# Patient Record
Sex: Male | Born: 1975 | Race: White | Hispanic: No | Marital: Single | Smoking: Current every day smoker
Health system: Southern US, Community
[De-identification: ages and names within clinical notes are randomized; demographics above are authoritative.]

## PROBLEM LIST (undated history)

## (undated) DIAGNOSIS — M549 Dorsalgia, unspecified: Secondary | ICD-10-CM

## (undated) DIAGNOSIS — F1920 Other psychoactive substance dependence, uncomplicated: Secondary | ICD-10-CM

## (undated) DIAGNOSIS — G8929 Other chronic pain: Secondary | ICD-10-CM

## (undated) DIAGNOSIS — F29 Unspecified psychosis not due to a substance or known physiological condition: Secondary | ICD-10-CM

## (undated) HISTORY — PX: OTHER SURGICAL HISTORY: SHX169

## (undated) HISTORY — PX: SHOULDER SURGERY: SHX246

---

## 2001-03-19 ENCOUNTER — Emergency Department (HOSPITAL_COMMUNITY): Admission: EM | Admit: 2001-03-19 | Discharge: 2001-03-19 | Payer: Self-pay | Admitting: Emergency Medicine

## 2004-04-29 ENCOUNTER — Emergency Department (HOSPITAL_COMMUNITY): Admission: EM | Admit: 2004-04-29 | Discharge: 2004-04-29 | Payer: Self-pay | Admitting: Emergency Medicine

## 2008-11-11 ENCOUNTER — Emergency Department (HOSPITAL_COMMUNITY): Admission: EM | Admit: 2008-11-11 | Discharge: 2008-11-11 | Payer: Self-pay | Admitting: Emergency Medicine

## 2009-01-13 ENCOUNTER — Emergency Department (HOSPITAL_COMMUNITY): Admission: EM | Admit: 2009-01-13 | Discharge: 2009-01-13 | Payer: Self-pay | Admitting: Emergency Medicine

## 2010-02-06 ENCOUNTER — Emergency Department (HOSPITAL_COMMUNITY): Admission: EM | Admit: 2010-02-06 | Discharge: 2010-02-06 | Payer: Self-pay | Admitting: Emergency Medicine

## 2010-02-11 ENCOUNTER — Emergency Department (HOSPITAL_COMMUNITY): Admission: EM | Admit: 2010-02-11 | Discharge: 2010-02-11 | Payer: Self-pay | Admitting: Emergency Medicine

## 2010-06-03 ENCOUNTER — Emergency Department (HOSPITAL_COMMUNITY)
Admission: EM | Admit: 2010-06-03 | Discharge: 2010-06-04 | Payer: Self-pay | Source: Home / Self Care | Admitting: Emergency Medicine

## 2010-09-28 ENCOUNTER — Emergency Department (HOSPITAL_COMMUNITY): Payer: BC Managed Care – PPO

## 2010-09-28 ENCOUNTER — Emergency Department (HOSPITAL_COMMUNITY)
Admission: EM | Admit: 2010-09-28 | Discharge: 2010-09-28 | Disposition: A | Discharge status: Home / Self Care | Payer: BC Managed Care – PPO | Attending: Emergency Medicine | Admitting: Emergency Medicine

## 2010-09-28 DIAGNOSIS — W260XXA Contact with knife, initial encounter: Secondary | ICD-10-CM | POA: Insufficient documentation

## 2010-09-28 DIAGNOSIS — S51809A Unspecified open wound of unspecified forearm, initial encounter: Secondary | ICD-10-CM | POA: Insufficient documentation

## 2010-09-28 DIAGNOSIS — Y92009 Unspecified place in unspecified non-institutional (private) residence as the place of occurrence of the external cause: Secondary | ICD-10-CM | POA: Insufficient documentation

## 2010-09-28 LAB — CBC
HCT: 33.1 % — ABNORMAL LOW (ref 39.0–52.0)
Hemoglobin: 11.3 g/dL — ABNORMAL LOW (ref 13.0–17.0)
MCH: 31.2 pg (ref 26.0–34.0)
MCHC: 34.1 g/dL (ref 30.0–36.0)
MCV: 91.4 fL (ref 78.0–100.0)
Platelets: 284 10*3/uL (ref 150–400)
RBC: 3.62 MIL/uL — ABNORMAL LOW (ref 4.22–5.81)
RDW: 14 % (ref 11.5–15.5)

## 2010-09-28 LAB — BASIC METABOLIC PANEL
BUN: 5 mg/dL — ABNORMAL LOW (ref 6–23)
CO2: 25 mEq/L (ref 19–32)
Calcium: 9 mg/dL (ref 8.4–10.5)
Chloride: 104 mEq/L (ref 96–112)
Creatinine, Ser: 0.79 mg/dL (ref 0.4–1.5)
GFR calc Af Amer: >60 mL/min (ref >60)
GFR calc non Af Amer: >60 mL/min (ref >60)
Glucose, Bld: 112 mg/dL — ABNORMAL HIGH (ref 70–99)
Sodium: 137 mEq/L (ref 135–145)

## 2010-09-28 LAB — URINALYSIS, ROUTINE W REFLEX MICROSCOPIC
Glucose, UA: NEGATIVE mg/dL
Hgb urine dipstick: NEGATIVE
Ketones, ur: NEGATIVE mg/dL
Specific Gravity, Urine: >1.03 — ABNORMAL HIGH (ref 1.005–1.030)
Urobilinogen, UA: 0.2 mg/dL (ref 0.0–1.0)
pH: 6 (ref 5.0–8.0)

## 2010-09-28 LAB — DIFFERENTIAL
Basophils Absolute: 0 10*3/uL (ref 0.0–0.1)
Basophils Relative: 0 % (ref 0–1)
Eosinophils Absolute: 0.1 10*3/uL (ref 0.0–0.7)
Eosinophils Relative: 0 % (ref 0–5)
Lymphocytes Relative: 17 % (ref 12–46)
Lymphs Abs: 2.7 10*3/uL (ref 0.7–4.0)
Monocytes Absolute: 1.1 10*3/uL — ABNORMAL HIGH (ref 0.1–1.0)
Monocytes Relative: 7 % (ref 3–12)
Neutro Abs: 12.3 10*3/uL — ABNORMAL HIGH (ref 1.7–7.7)

## 2010-09-28 LAB — URINE MICROSCOPIC-ADD ON

## 2010-09-28 LAB — RAPID URINE DRUG SCREEN, HOSP PERFORMED
Amphetamines: NOT DETECTED
Barbiturates: NOT DETECTED
Benzodiazepines: NOT DETECTED
Cocaine: POSITIVE — AB
Opiates: NOT DETECTED

## 2010-09-28 LAB — ETHANOL: Alcohol, Ethyl (B): <11 mg/dL — ABNORMAL HIGH (ref 0–10)
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
## 2010-09-29 ENCOUNTER — Encounter (HOSPITAL_COMMUNITY): Payer: BC Managed Care – PPO

## 2010-09-29 ENCOUNTER — Other Ambulatory Visit: Payer: Self-pay | Admitting: Infectious Diseases

## 2010-09-29 LAB — COMPREHENSIVE METABOLIC PANEL WITH GFR
ALT: 6 U/L (ref 0–53)
AST: 12 U/L (ref 0–37)
Albumin: 3.7 g/dL (ref 3.5–5.2)
Alkaline Phosphatase: 78 U/L (ref 39–117)
BUN: 3 mg/dL — ABNORMAL LOW (ref 6–23)
CO2: 29 meq/L (ref 19–32)
Calcium: 9.8 mg/dL (ref 8.4–10.5)
Chloride: 102 meq/L (ref 96–112)
Creatinine, Ser: 0.77 mg/dL (ref 0.4–1.5)
GFR calc non Af Amer: >60 mL/min
Glucose, Bld: 96 mg/dL (ref 70–99)
Potassium: 4.2 meq/L (ref 3.5–5.1)
Sodium: 137 meq/L (ref 135–145)
Total Bilirubin: 0.3 mg/dL (ref 0.3–1.2)
Total Protein: 7 g/dL (ref 6.0–8.3)

## 2010-09-29 LAB — CBC
HCT: 37.3 % — ABNORMAL LOW (ref 39.0–52.0)
Hemoglobin: 12.2 g/dL — ABNORMAL LOW (ref 13.0–17.0)
MCH: 30.9 pg (ref 26.0–34.0)
MCHC: 32.7 g/dL (ref 30.0–36.0)
MCV: 94.4 fL (ref 78.0–100.0)
Platelets: 347 K/uL (ref 150–400)
RBC: 3.95 MIL/uL — ABNORMAL LOW (ref 4.22–5.81)
RDW: 14.2 % (ref 11.5–15.5)
WBC: 10.7 K/uL — ABNORMAL HIGH (ref 4.0–10.5)

## 2010-09-29 LAB — URINALYSIS, ROUTINE W REFLEX MICROSCOPIC
Bilirubin Urine: NEGATIVE
Glucose, UA: NEGATIVE mg/dL
Hgb urine dipstick: NEGATIVE
Ketones, ur: NEGATIVE mg/dL
Nitrite: NEGATIVE
Protein, ur: NEGATIVE mg/dL
Specific Gravity, Urine: 1.01 (ref 1.005–1.030)
Urobilinogen, UA: 0.2 mg/dL (ref 0.0–1.0)
pH: 6.5 (ref 5.0–8.0)

## 2010-09-29 LAB — SURGICAL PCR SCREEN
MRSA, PCR: NEGATIVE
Staphylococcus aureus: POSITIVE — AB

## 2010-10-01 ENCOUNTER — Ambulatory Visit (HOSPITAL_COMMUNITY)
Admission: RE | Admit: 2010-10-01 | Discharge: 2010-10-01 | Disposition: A | Discharge status: Home / Self Care | Payer: BC Managed Care – PPO | Source: Ambulatory Visit | Attending: Orthopaedic Surgery | Admitting: Orthopaedic Surgery

## 2010-10-01 ENCOUNTER — Emergency Department (HOSPITAL_COMMUNITY)
Admission: EM | Admit: 2010-10-01 | Discharge: 2010-10-01 | Discharge status: Against Medical Advice | Payer: BC Managed Care – PPO | Attending: Emergency Medicine | Admitting: Emergency Medicine

## 2010-10-01 DIAGNOSIS — S56909A Unspecified injury of unspecified muscles, fascia and tendons at forearm level, unspecified arm, initial encounter: Secondary | ICD-10-CM | POA: Insufficient documentation

## 2010-10-01 DIAGNOSIS — W261XXA Contact with sword or dagger, initial encounter: Secondary | ICD-10-CM | POA: Insufficient documentation

## 2010-10-01 DIAGNOSIS — W260XXA Contact with knife, initial encounter: Secondary | ICD-10-CM | POA: Insufficient documentation

## 2010-10-01 DIAGNOSIS — Z5309 Procedure and treatment not carried out because of other contraindication: Secondary | ICD-10-CM | POA: Insufficient documentation

## 2010-10-01 DIAGNOSIS — Z01812 Encounter for preprocedural laboratory examination: Secondary | ICD-10-CM | POA: Insufficient documentation

## 2010-10-01 DIAGNOSIS — S51809A Unspecified open wound of unspecified forearm, initial encounter: Secondary | ICD-10-CM | POA: Insufficient documentation

## 2010-10-01 LAB — RAPID URINE DRUG SCREEN, HOSP PERFORMED
Barbiturates: NOT DETECTED
Benzodiazepines: NOT DETECTED
Cocaine: POSITIVE — AB
Opiates: POSITIVE — AB
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
## 2010-10-05 ENCOUNTER — Ambulatory Visit (HOSPITAL_COMMUNITY)
Admission: RE | Admit: 2010-10-05 | Discharge: 2010-10-06 | Disposition: A | Discharge status: Home / Self Care | Payer: BC Managed Care – PPO | Source: Ambulatory Visit | Attending: Orthopaedic Surgery | Admitting: Orthopaedic Surgery

## 2010-10-05 DIAGNOSIS — W260XXA Contact with knife, initial encounter: Secondary | ICD-10-CM | POA: Insufficient documentation

## 2010-10-05 DIAGNOSIS — S51809A Unspecified open wound of unspecified forearm, initial encounter: Secondary | ICD-10-CM | POA: Insufficient documentation

## 2010-10-05 LAB — RAPID URINE DRUG SCREEN, HOSP PERFORMED
Amphetamines: NOT DETECTED
Barbiturates: NOT DETECTED
Benzodiazepines: NOT DETECTED
Opiates: POSITIVE — AB
Tetrahydrocannabinol: NOT DETECTED

## 2010-10-07 NOTE — Discharge Summary (Signed)
  NAMEHALEN, Ronnie Wilson              ACCOUNT NO.:  [id number]  MEDICAL RECORD NO.:  [id number]           PATIENT TYPE:  O  LOCATION:  A203                          FACILITY:  [facility]  PHYSICIAN:  J. Darreld Mclean, M.D. DATE OF BIRTH:  1975/11/17  DATE OF ADMISSION:  10/05/2010 DATE OF DISCHARGE:  05/15/2012LH                              DISCHARGE SUMMARY   DISCHARGE DIAGNOSIS:  Laceration, extensor tendons (six) left forearm.  PROCEDURE:  Forearm repair of extensor tendons (six) left forearm.  DISCHARGE STATUS AND PROGNOSIS:  Good.  DISPOSITION:  Home.  DISCHARGE MEDICATIONS:  Norco 7.5/325 one every 4 hours p.r.n. pain.  Keep dressing dry and to be seen in my office on May 28.  The patient was admitted after surgery yesterday with repair of 6 tendons to the left midforearm dorsally.  He has done well and received a PCA IV morphine during the evening and remained afebrile.  I did add nicotine patches as he is a smoker.  His pain is controlled.  I have explained to him to keep dressing dry, keep the splint on, did not remove the splint, contact me if any difficulty or any problem.  His labs were normal.  The patient had recently scheduled for the surgery last week but had a positive drug screen prior to surgery, positive for cocaine, he has been told and advised strongly to avoid the cocaine.  I have talked to his family.  Lives with his mother, I also talked to her.  Again I will see him in the office if any difficulty.                                           ______________________________ Shela Commons. Darreld Mclean, M.D.     JWK/MEDQ  D:  10/06/2010  T:  10/06/2010  Job:  [id number]  Electronically Signed by Darreld Mclean M.D. on 10/07/2010 12:56:08 PM
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
## 2010-12-02 ENCOUNTER — Emergency Department (HOSPITAL_COMMUNITY): Payer: BC Managed Care – PPO

## 2010-12-02 ENCOUNTER — Emergency Department (HOSPITAL_COMMUNITY)
Admission: EM | Admit: 2010-12-02 | Discharge: 2010-12-02 | Disposition: A | Discharge status: Home / Self Care | Payer: BC Managed Care – PPO | Attending: Emergency Medicine | Admitting: Emergency Medicine

## 2010-12-02 ENCOUNTER — Encounter: Payer: Self-pay | Admitting: *Deleted

## 2010-12-02 DIAGNOSIS — F17200 Nicotine dependence, unspecified, uncomplicated: Secondary | ICD-10-CM | POA: Insufficient documentation

## 2010-12-02 DIAGNOSIS — M545 Low back pain, unspecified: Secondary | ICD-10-CM | POA: Insufficient documentation

## 2010-12-02 HISTORY — DX: Other chronic pain: G89.29

## 2010-12-02 HISTORY — DX: Dorsalgia, unspecified: M54.9

## 2010-12-02 MED ORDER — IBUPROFEN 800 MG PO TABS: 800.0000 mg | ORAL_TABLET | Freq: Three times a day (TID) | ORAL | Status: AC

## 2010-12-02 MED ORDER — CYCLOBENZAPRINE HCL 10 MG PO TABS: 10.0000 mg | ORAL_TABLET | Freq: Two times a day (BID) | ORAL | Status: AC | PRN

## 2010-12-02 MED ORDER — CYCLOBENZAPRINE HCL 10 MG PO TABS
10.0000 mg | ORAL_TABLET | Freq: Once | ORAL | Status: AC
  Administered 2010-12-02: 10 mg via ORAL
  Filled 2010-12-02: qty 1

## 2010-12-02 MED ORDER — KETOROLAC TROMETHAMINE 60 MG/2ML IM SOLN
60.0000 mg | Freq: Once | INTRAMUSCULAR | Status: AC
  Administered 2010-12-02: 60 mg via INTRAMUSCULAR
  Filled 2010-12-02: qty 2

## 2010-12-02 NOTE — ED Notes (Signed)
C/o lower back pain x 2 weeks. Denies injury. States that he has chronic back pain and it has gotten worse. Hurts to move.

## 2010-12-02 NOTE — ED Provider Notes (Signed)
History     Chief Complaint  Patient presents with  . Back Pain   HPI Comments: Completely relieved with rest / laying down - denies any red flags.  No meds given at home - denies imagine.  Patient is a 35 y.o. male presenting with back pain. The history is provided by the patient.  Back Pain  This is a chronic problem. Episode onset: 2 yearsa ago, worse in the last 2 weeks. Episode frequency: intermittently. The problem has been gradually worsening. The pain is associated with no known injury. The pain is present in the lumbar spine (bilateral lower back). The pain is at a severity of 8/10. The pain is moderate. The symptoms are aggravated by bending (walking). Worse during: sharp. Pertinent negatives include no fever, no numbness and no weakness. He has tried nothing for the symptoms.    Past Medical History  Diagnosis Date  . Chronic back pain     Past Surgical History  Procedure Date  . Arm surgery  left     tendon and laceration repair  . Shoulder surgery     History reviewed. No pertinent family history.  History  Substance Use Topics  . Smoking status: Current Everyday Smoker -- 1.5 packs/day  . Smokeless tobacco: Not on file  . Alcohol Use: No      Review of Systems  Constitutional: Negative for fever and chills.  HENT: Negative for neck pain.   Genitourinary: Negative for difficulty urinating.  Musculoskeletal: Positive for back pain.  Neurological: Negative for weakness and numbness.    Physical Exam  BP 156/87  Pulse 95  Temp(Src) 98.9 F (37.2 C) (Oral)  Resp 20  Ht 5\' 6"  (1.676 m)  Wt 145 lb (65.772 kg)  BMI 23.40 kg/m2  SpO2 100%  Physical Exam  Nursing note and vitals reviewed. Constitutional: He appears well-developed and well-nourished. No distress.  HENT:  Head: Normocephalic and atraumatic.  Eyes: Conjunctivae are normal. No scleral icterus.  Neck: Normal range of motion. Neck supple.  Cardiovascular: Normal rate and regular rhythm.     No murmur heard. Pulmonary/Chest: Effort normal and breath sounds normal. No respiratory distress.  Musculoskeletal: Normal range of motion. He exhibits no edema and no tenderness.       Lumbar back: He exhibits pain.  Neurological: He is alert. He has normal strength. No sensory deficit. Coordination and gait normal.  Reflex Scores:      Patellar reflexes are 2+ on the right side and 2+ on the left side.      Gait normal, speech clear  Skin: He is not diaphoretic.    ED Course  Procedures  MDM No imaging in the past - has normal neuro exam, worsening pain - meds orderedf including IM toradol and flexeril.    Pt xray without acute abnormalities. Will d/c home, return precautions given.  I have reviewed the results with pt  who is aware of findings and has expressed understanding and need for appropriate follow up.  All of pt questions pertaining to results have been answered.   Vida Roller, MD 12/02/10 2051

## 2011-02-04 ENCOUNTER — Emergency Department (HOSPITAL_COMMUNITY)
Admission: EM | Admit: 2011-02-04 | Discharge: 2011-02-05 | Disposition: A | Discharge status: Home / Self Care | Payer: BC Managed Care – PPO | Attending: Emergency Medicine | Admitting: Emergency Medicine

## 2011-02-04 ENCOUNTER — Encounter (HOSPITAL_COMMUNITY): Payer: Self-pay | Admitting: *Deleted

## 2011-02-04 DIAGNOSIS — M5412 Radiculopathy, cervical region: Secondary | ICD-10-CM | POA: Insufficient documentation

## 2011-02-04 DIAGNOSIS — F17200 Nicotine dependence, unspecified, uncomplicated: Secondary | ICD-10-CM | POA: Insufficient documentation

## 2011-02-04 DIAGNOSIS — G8929 Other chronic pain: Secondary | ICD-10-CM | POA: Insufficient documentation
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
## 2011-02-05 MED ORDER — NAPROXEN 500 MG PO TABS: 500.0000 mg | ORAL_TABLET | Freq: Two times a day (BID) | ORAL | Status: DC

## 2011-02-05 MED ORDER — DEXAMETHASONE SODIUM PHOSPHATE 4 MG/ML IJ SOLN
10.0000 mg | Freq: Once | INTRAMUSCULAR | Status: AC
  Administered 2011-02-05: 10 mg via INTRAMUSCULAR
  Filled 2011-02-05: qty 3

## 2011-02-05 MED ORDER — CYCLOBENZAPRINE HCL 10 MG PO TABS
10.0000 mg | ORAL_TABLET | Freq: Once | ORAL | Status: AC
  Administered 2011-02-05: 10 mg via ORAL
  Filled 2011-02-05: qty 1

## 2011-02-05 MED ORDER — CYCLOBENZAPRINE HCL 10 MG PO TABS: 10.0000 mg | ORAL_TABLET | Freq: Two times a day (BID) | ORAL | Status: AC | PRN

## 2011-02-05 MED ORDER — KETOROLAC TROMETHAMINE 60 MG/2ML IM SOLN
60.0000 mg | Freq: Once | INTRAMUSCULAR | Status: AC
  Administered 2011-02-05: 60 mg via INTRAMUSCULAR
  Filled 2011-02-05: qty 2

## 2011-02-05 NOTE — ED Notes (Signed)
Pt left er stating no needs. Pt self ambulated with a steady gait
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
## 2011-02-16 ENCOUNTER — Other Ambulatory Visit: Payer: Self-pay | Admitting: Sports Medicine

## 2011-02-16 DIAGNOSIS — M545 Low back pain: Secondary | ICD-10-CM

## 2011-02-16 DIAGNOSIS — M5126 Other intervertebral disc displacement, lumbar region: Secondary | ICD-10-CM

## 2011-02-20 ENCOUNTER — Ambulatory Visit
Admission: RE | Admit: 2011-02-20 | Discharge: 2011-02-20 | Disposition: A | Discharge status: Home / Self Care | Payer: BC Managed Care – PPO | Source: Ambulatory Visit | Attending: Sports Medicine | Admitting: Sports Medicine

## 2011-02-20 DIAGNOSIS — M545 Low back pain: Secondary | ICD-10-CM

## 2011-02-20 DIAGNOSIS — M5126 Other intervertebral disc displacement, lumbar region: Secondary | ICD-10-CM

## 2011-06-04 ENCOUNTER — Encounter (HOSPITAL_COMMUNITY): Payer: Self-pay

## 2011-06-04 ENCOUNTER — Emergency Department (HOSPITAL_COMMUNITY): Payer: Self-pay

## 2011-06-04 ENCOUNTER — Emergency Department (HOSPITAL_COMMUNITY)
Admission: EM | Admit: 2011-06-04 | Discharge: 2011-06-04 | Disposition: A | Discharge status: Home / Self Care | Payer: Self-pay | Attending: Emergency Medicine | Admitting: Emergency Medicine

## 2011-06-04 DIAGNOSIS — J111 Influenza due to unidentified influenza virus with other respiratory manifestations: Secondary | ICD-10-CM | POA: Insufficient documentation

## 2011-06-04 DIAGNOSIS — M549 Dorsalgia, unspecified: Secondary | ICD-10-CM | POA: Insufficient documentation

## 2011-06-04 DIAGNOSIS — G8929 Other chronic pain: Secondary | ICD-10-CM | POA: Insufficient documentation

## 2011-06-04 DIAGNOSIS — F17200 Nicotine dependence, unspecified, uncomplicated: Secondary | ICD-10-CM | POA: Insufficient documentation

## 2011-06-04 MED ORDER — PREDNISONE 20 MG PO TABS
60.0000 mg | ORAL_TABLET | Freq: Once | ORAL | Status: AC
  Administered 2011-06-04: 60 mg via ORAL
  Filled 2011-06-04: qty 3

## 2011-06-04 MED ORDER — ALBUTEROL SULFATE HFA 108 (90 BASE) MCG/ACT IN AERS
2.0000 | INHALATION_SPRAY | RESPIRATORY_TRACT | Status: DC
  Administered 2011-06-04: 2 via RESPIRATORY_TRACT
  Filled 2011-06-04: qty 6.7

## 2011-06-04 MED ORDER — HYDROCOD POLST-CHLORPHEN POLST 10-8 MG/5ML PO LQCR: 5.0000 mL | Freq: Two times a day (BID) | ORAL | Status: DC

## 2011-06-04 MED ORDER — DOXYCYCLINE HYCLATE 100 MG PO CAPS: 100.0000 mg | ORAL_CAPSULE | Freq: Two times a day (BID) | ORAL | Status: AC

## 2011-06-04 MED ORDER — PREDNISONE 10 MG PO TABS: ORAL_TABLET | ORAL | Status: DC

## 2011-06-04 MED ORDER — HYDROCOD POLST-CHLORPHEN POLST 10-8 MG/5ML PO LQCR
5.0000 mL | Freq: Once | ORAL | Status: AC
  Administered 2011-06-04: 5 mL via ORAL
  Filled 2011-06-04: qty 5

## 2011-06-04 MED ORDER — ONDANSETRON 4 MG PO TBDP
4.0000 mg | ORAL_TABLET | Freq: Once | ORAL | Status: AC
  Administered 2011-06-04: 4 mg via ORAL
  Filled 2011-06-04: qty 1

## 2011-06-04 MED ORDER — DOXYCYCLINE HYCLATE 100 MG PO TABS
100.0000 mg | ORAL_TABLET | Freq: Once | ORAL | Status: AC
Start: 2011-06-04 — End: 2011-06-04
  Administered 2011-06-04: 100 mg via ORAL
  Filled 2011-06-04: qty 1

## 2011-06-04 NOTE — ED Notes (Signed)
Discharge instructions discussed with pt as well as meds. Pt to be discharged after being seen by Resp.

## 2011-06-04 NOTE — ED Provider Notes (Signed)
History     CSN: [id number]  Arrival date & time 06/04/11  1501   First MD Initiated Contact with Patient 06/04/11 1803      Chief Complaint  Patient presents with  . Generalized Body Aches  . Weakness  . Chest Pain  . Cough    (Consider location/radiation/quality/duration/timing/severity/associated sxs/prior treatment) HPI Comments: Patient presents with one week of increased congestion nasal congestion cough and pain with deep breathing and cough, more on the right than the left. He complains of generalized body aches. He has a productive cough but there is no hemoptysis. He is sure he's had fever but has not measured her temperature at home. Patient complains of increased weakness and generally not feeling well. He presents at this time for additional evaluation and possible treatment of these problems.  Patient is a 36 y.o. male presenting with weakness, chest pain, and cough. The history is provided by the patient.  Weakness The primary symptoms include fever. Primary symptoms do not include seizures or dizziness.  Additional symptoms include weakness. Additional symptoms do not include photophobia or hallucinations.  Chest Pain Primary symptoms include a fever, fatigue and cough. Pertinent negatives for primary symptoms include no shortness of breath, no wheezing, no palpitations, no abdominal pain and no dizziness.  Associated symptoms include weakness.  Pertinent negatives for past medical history include no seizures.    Cough Associated symptoms include chest pain. Pertinent negatives include no shortness of breath and no wheezing.    Past Medical History  Diagnosis Date  . Chronic back pain     Past Surgical History  Procedure Date  . Arm surgery  left     tendon and laceration repair  . Shoulder surgery     No family history on file.  History  Substance Use Topics  . Smoking status: Current Everyday Smoker -- 1.5 packs/day  . Smokeless tobacco: Not on  file  . Alcohol Use: No      Review of Systems  Constitutional: Positive for fever and fatigue. Negative for activity change.       All ROS Neg except as noted in HPI  HENT: Positive for congestion and postnasal drip. Negative for nosebleeds and neck pain.   Eyes: Negative for photophobia and discharge.  Respiratory: Positive for cough. Negative for shortness of breath and wheezing.   Cardiovascular: Positive for chest pain. Negative for palpitations.  Gastrointestinal: Negative for abdominal pain and blood in stool.  Genitourinary: Negative for dysuria, frequency and hematuria.  Musculoskeletal: Negative for back pain and arthralgias.  Skin: Negative.   Neurological: Positive for weakness. Negative for dizziness, seizures and speech difficulty.  Psychiatric/Behavioral: Negative for hallucinations and confusion.    Allergies  Review of patient's allergies indicates no known allergies.  Home Medications   Current Outpatient Rx  Name Route Sig Dispense Refill  . NAPROXEN 500 MG PO TABS Oral Take 1 tablet (500 mg total) by mouth 2 (two) times daily. 30 tablet 0    BP 129/77  Pulse 88  Temp(Src) 98.8 F (37.1 C) (Oral)  Resp 20  SpO2 100%  Physical Exam  Nursing note and vitals reviewed. Constitutional: He is oriented to person, place, and time. He appears well-developed and well-nourished.  Non-toxic appearance.  HENT:  Head: Normocephalic.  Right Ear: Tympanic membrane and external ear normal.  Left Ear: Tympanic membrane and external ear normal.       Nasal congestion present.  Eyes: EOM and lids are normal. Pupils are equal, round, and  reactive to light.  Neck: Normal range of motion. Neck supple. Carotid bruit is not present.  Cardiovascular: Normal rate, regular rhythm, normal heart sounds, intact distal pulses and normal pulses.   Pulmonary/Chest: No respiratory distress. He has wheezes. He exhibits tenderness.  Abdominal: Soft. Bowel sounds are normal. There is  no tenderness. There is no guarding.  Musculoskeletal: Normal range of motion.  Lymphadenopathy:       Head (right side): No submandibular adenopathy present.       Head (left side): No submandibular adenopathy present.    He has no cervical adenopathy.  Neurological: He is alert and oriented to person, place, and time. He has normal strength. No cranial nerve deficit or sensory deficit.  Skin: Skin is warm and dry. No rash noted.  Psychiatric: He has a normal mood and affect. His speech is normal.    ED Course  Procedures (including critical care time) Pulse oximetry 100% on room air. Within normal limits by my interpretation. Labs Reviewed - No data to display Dg Chest 2 View  06/04/2011  *RADIOLOGY REPORT*  Clinical Data: Cough with shortness of breath.  CHEST - 2 VIEW  Comparison: None.  Findings: The lungs are clear without focal infiltrate, edema, pneumothorax or pleural effusion. The cardiopericardial silhouette is within normal limits for size. Imaged bony structures of the thorax are intact.  IMPRESSION: No acute findings.  Original Report Authenticated By: ERIC A. MANSELL, M.D.     Dx: Influenza   MDM  I have reviewed nursing notes, vital signs, and all appropriate lab and imaging results for this patient. Patient has a one-week history of increasing cough at times is productive. He has increased weakness and generalized body aches as well as some nasal congestion. Patient is advised to use influenza precautions. He is to increase fluids. Wash hands frequently. He is to use Tylenol and Motrin for fever. He will be prescribed Tussionex, prednisone, doxycycline, and given an inhaler of albuterol.       Kathie Dike, [place] 06/04/11 1812

## 2011-06-04 NOTE — ED Notes (Signed)
Awaiting Resp tx for pt to be discharged.

## 2011-06-04 NOTE — ED Notes (Signed)
Pt presents with cough, right sided chest pain when coughing, weakness, and general body aches x 7 day.

## 2011-06-05 NOTE — ED Provider Notes (Signed)
Medical screening examination/treatment/procedure(s) were performed by non-physician practitioner and as supervising physician I was immediately available for consultation/collaboration.   Laray Anger, DO 06/05/11 0013

## 2011-08-17 ENCOUNTER — Encounter (HOSPITAL_COMMUNITY): Payer: Self-pay | Admitting: *Deleted

## 2011-08-17 ENCOUNTER — Emergency Department (HOSPITAL_COMMUNITY)
Admission: EM | Admit: 2011-08-17 | Discharge: 2011-08-18 | Disposition: A | Discharge status: Home / Self Care | Payer: Self-pay | Attending: Emergency Medicine | Admitting: Emergency Medicine

## 2011-08-17 DIAGNOSIS — M549 Dorsalgia, unspecified: Secondary | ICD-10-CM | POA: Insufficient documentation

## 2011-08-17 DIAGNOSIS — F17200 Nicotine dependence, unspecified, uncomplicated: Secondary | ICD-10-CM | POA: Insufficient documentation

## 2011-08-17 DIAGNOSIS — G8929 Other chronic pain: Secondary | ICD-10-CM | POA: Insufficient documentation

## 2011-08-17 NOTE — ED Notes (Signed)
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

## 2011-08-18 ENCOUNTER — Emergency Department (HOSPITAL_COMMUNITY): Payer: Self-pay

## 2011-08-18 MED ORDER — OXYCODONE-ACETAMINOPHEN 5-325 MG PO TABS: 1.0000 | ORAL_TABLET | Freq: Four times a day (QID) | ORAL | Status: AC | PRN

## 2011-08-18 MED ORDER — ONDANSETRON HCL 4 MG/2ML IJ SOLN
4.0000 mg | Freq: Once | INTRAMUSCULAR | Status: AC
  Administered 2011-08-18: 4 mg via INTRAVENOUS
  Filled 2011-08-18: qty 2

## 2011-08-18 MED ORDER — PREDNISONE 10 MG PO TABS: 20.0000 mg | ORAL_TABLET | Freq: Two times a day (BID) | ORAL | Status: DC

## 2011-08-18 MED ORDER — HYDROMORPHONE HCL PF 2 MG/ML IJ SOLN
2.0000 mg | Freq: Once | INTRAMUSCULAR | Status: AC
  Administered 2011-08-18: 2 mg via INTRAVENOUS
  Filled 2011-08-18: qty 1

## 2011-08-18 MED ORDER — METHYLPREDNISOLONE SODIUM SUCC 125 MG IJ SOLR
125.0000 mg | Freq: Once | INTRAMUSCULAR | Status: AC
  Administered 2011-08-18: 125 mg via INTRAVENOUS
  Filled 2011-08-18: qty 2

## 2011-08-18 MED ORDER — OXYCODONE-ACETAMINOPHEN 5-325 MG PO TABS
2.0000 | ORAL_TABLET | Freq: Once | ORAL | Status: AC
  Administered 2011-08-18: 2 via ORAL
  Filled 2011-08-18: qty 2

## 2011-08-18 NOTE — Discharge Instructions (Signed)
Follow up as needed Dr. Regino Schultze can see you for your back pain.  He can refer you to specialist as needed.  You need to establish yourself with a primary care doctor.

## 2011-08-19 NOTE — ED Provider Notes (Signed)
History     CSN: [id number]  Arrival date & time 08/17/11  2232   First MD Initiated Contact with Patient 08/18/11 0013      Chief Complaint  Patient presents with  . Back Pain    (Consider location/radiation/quality/duration/timing/severity/associated sxs/prior treatment) Patient is a 36 y.o. male presenting with back pain. The history is provided by the patient (pt complains of right low back pain). A language interpreter was used.  Back Pain  This is a recurrent problem. The current episode started 12 to 24 hours ago. The problem occurs constantly. The problem has not changed since onset.The pain is associated with no known injury. The pain is present in the lumbar spine. The quality of the pain is described as shooting. The pain radiates to the right thigh. The pain is at a severity of 6/10. The pain is moderate. The symptoms are aggravated by bending and twisting. The pain is the same all the time. Stiffness is present all day. Pertinent negatives include no chest pain, no fever, no headaches and no abdominal pain. He has tried nothing for the symptoms. The treatment provided no relief. Risk factors include lack of exercise.    Past Medical History  Diagnosis Date  . Chronic back pain     Past Surgical History  Procedure Date  . Arm surgery  left     tendon and laceration repair  . Shoulder surgery     History reviewed. No pertinent family history.  History  Substance Use Topics  . Smoking status: Current Everyday Smoker -- 1.5 packs/day    Types: Cigarettes  . Smokeless tobacco: Not on file  . Alcohol Use: No      Review of Systems  Constitutional: Negative for fever and fatigue.  HENT: Negative for congestion, sinus pressure and ear discharge.   Eyes: Negative for discharge.  Respiratory: Negative for cough.   Cardiovascular: Negative for chest pain.  Gastrointestinal: Negative for abdominal pain and diarrhea.  Genitourinary: Negative for frequency and  hematuria.  Musculoskeletal: Positive for back pain.  Skin: Negative for rash.  Neurological: Negative for seizures and headaches.  Hematological: Negative.   Psychiatric/Behavioral: Negative for hallucinations.    Allergies  Review of patient's allergies indicates no known allergies.  Home Medications   Current Outpatient Rx  Name Route Sig Dispense Refill  . HYDROCOD POLST-CPM POLST ER 10-8 MG/5ML PO LQCR Oral Take 5 mLs by mouth 2 times daily at 12 noon and 4 pm. 100 mL 0  . OXYCODONE-ACETAMINOPHEN 5-325 MG PO TABS Oral Take 1 tablet by mouth every 6 (six) hours as needed for pain. 30 tablet 0  . PREDNISONE 10 MG PO TABS  6,5,4,3,2,1 - take with food 21 tablet 0  . PREDNISONE 10 MG PO TABS Oral Take 2 tablets (20 mg total) by mouth 2 (two) times daily. 15 tablet 0    BP 140/90  Pulse 88  Temp(Src) 98.8 F (37.1 C) (Oral)  Resp 16  Ht 5\' 6"  (1.676 m)  Wt 135 lb (61.236 kg)  BMI 21.79 kg/m2  SpO2 94%  Physical Exam  Constitutional: He is oriented to person, place, and time. He appears well-developed.  HENT:  Head: Normocephalic and atraumatic.  Eyes: Conjunctivae and EOM are normal. No scleral icterus.  Neck: Neck supple. No thyromegaly present.  Cardiovascular: Normal rate and regular rhythm.  Exam reveals no gallop and no friction rub.   No murmur heard. Pulmonary/Chest: No stridor. He has no wheezes. He has no rales. He  exhibits no tenderness.  Abdominal: He exhibits no distension. There is no tenderness. There is no rebound.  Musculoskeletal: He exhibits no edema.       Tender right lumbar muscles with pos.  straigh leg raise on right  Lymphadenopathy:    He has no cervical adenopathy.  Neurological: He is oriented to person, place, and time. He displays normal reflexes. Coordination normal.  Skin: No rash noted. No erythema.  Psychiatric: He has a normal mood and affect. His behavior is normal.    ED Course  Procedures (including critical care time)  Labs  Reviewed - No data to display Dg Lumbar Spine Complete  08/18/2011  *RADIOLOGY REPORT*  Clinical Data: 36 year old male with low back pain.  LUMBAR SPINE - COMPLETE 4+ VIEW  Comparison: 02/19/2011  Findings: Five non-rib bearing lumbar type vertebra are again identified. There is no evidence of fracture or subluxation. The disc spaces are maintained. There is no evidence of focal bony lesion or spondylolysis.  IMPRESSION: Unremarkable lumbar spine series.  Original Report Authenticated By: Rosendo Gros, M.D.     1. Back pain    Pt improved with tx   MDM  Back pain with sciatica.  Normal mri in august.  Pt to follow up         Benny Lennert, MD 08/19/11 [phone]

## 2011-09-05 ENCOUNTER — Encounter (HOSPITAL_COMMUNITY): Payer: Self-pay | Admitting: *Deleted

## 2011-09-05 ENCOUNTER — Emergency Department (HOSPITAL_COMMUNITY)
Admission: EM | Admit: 2011-09-05 | Discharge: 2011-09-06 | Disposition: A | Discharge status: Home / Self Care | Payer: Self-pay | Attending: Emergency Medicine | Admitting: Emergency Medicine

## 2011-09-05 DIAGNOSIS — M549 Dorsalgia, unspecified: Secondary | ICD-10-CM | POA: Insufficient documentation

## 2011-09-05 DIAGNOSIS — F17200 Nicotine dependence, unspecified, uncomplicated: Secondary | ICD-10-CM | POA: Insufficient documentation

## 2011-09-05 DIAGNOSIS — R209 Unspecified disturbances of skin sensation: Secondary | ICD-10-CM | POA: Insufficient documentation

## 2011-09-05 DIAGNOSIS — G8929 Other chronic pain: Secondary | ICD-10-CM

## 2011-09-05 DIAGNOSIS — M545 Low back pain, unspecified: Secondary | ICD-10-CM | POA: Insufficient documentation

## 2011-09-05 MED ORDER — KETOROLAC TROMETHAMINE 60 MG/2ML IM SOLN
60.0000 mg | Freq: Once | INTRAMUSCULAR | Status: AC
  Administered 2011-09-06: 60 mg via INTRAMUSCULAR
  Filled 2011-09-05: qty 2

## 2011-09-05 MED ORDER — CYCLOBENZAPRINE HCL 10 MG PO TABS
10.0000 mg | ORAL_TABLET | Freq: Once | ORAL | Status: AC
  Administered 2011-09-06: 10 mg via ORAL
  Filled 2011-09-05: qty 1

## 2011-09-05 NOTE — ED Notes (Signed)
C/o chronic lower back pain, worse today

## 2011-09-06 ENCOUNTER — Encounter (HOSPITAL_COMMUNITY): Payer: Self-pay | Admitting: Emergency Medicine

## 2011-09-06 ENCOUNTER — Emergency Department (HOSPITAL_COMMUNITY): Payer: Self-pay

## 2011-09-06 MED ORDER — CYCLOBENZAPRINE HCL 10 MG PO TABS: 10.0000 mg | ORAL_TABLET | Freq: Three times a day (TID) | ORAL | Status: AC | PRN

## 2011-09-06 NOTE — ED Provider Notes (Signed)
History     CSN: [id number]  Arrival date & time 09/05/11  2236   First MD Initiated Contact with Patient 09/05/11 2352      Chief Complaint  Patient presents with  . Back Pain    (Consider location/radiation/quality/duration/timing/severity/associated sxs/prior treatment) HPI Comments: Patient c/o persistent low back pain that's been persistent for several months.  C/o pain that radiates to his left buttock, thigh and to his foot.  Describes a "burning, numb sensation" in his leg when in the seated position that resolves with lying flat or standing.  He denies incontinence of feces or urine, dysuria, or perineal numbness.  Patient states he was seen here last month for similar symptoms and given referral for a PMD, but he was unable to get an appt.    Patient is a 36 y.o. male presenting with back pain. The history is provided by the patient.  Back Pain  This is a chronic problem. The current episode started more than 1 week ago. The problem occurs constantly. The problem has not changed since onset.The pain is associated with no known injury. The pain is present in the lumbar spine and sacro-iliac joint. The quality of the pain is described as burning, aching and shooting. The pain radiates to the left thigh and left foot. The pain is moderate. The symptoms are aggravated by certain positions, twisting and bending (sitting position). The pain is the same all the time. Associated symptoms include numbness, leg pain and tingling. Pertinent negatives include no chest pain, no fever, no abdominal pain, no abdominal swelling, no bowel incontinence, no perianal numbness, no bladder incontinence, no dysuria, no pelvic pain, no paresthesias, no paresis and no weakness. He has tried NSAIDs, muscle relaxants and analgesics for the symptoms. The treatment provided no relief.    Past Medical History  Diagnosis Date  . Chronic back pain     Past Surgical History  Procedure Date  . Arm surgery  left      tendon and laceration repair  . Shoulder surgery     No family history on file.  History  Substance Use Topics  . Smoking status: Current Everyday Smoker -- 1.5 packs/day    Types: Cigarettes  . Smokeless tobacco: Not on file  . Alcohol Use: No      Review of Systems  Constitutional: Negative for fever and appetite change.  Cardiovascular: Negative for chest pain.  Gastrointestinal: Negative for nausea, vomiting, abdominal pain and bowel incontinence.  Genitourinary: Negative for bladder incontinence, dysuria, hematuria, flank pain, decreased urine volume and pelvic pain.  Musculoskeletal: Positive for back pain. Negative for joint swelling.  Skin: Negative.   Neurological: Positive for tingling and numbness. Negative for weakness and paresthesias.  All other systems reviewed and are negative.    Allergies  Review of patient's allergies indicates no known allergies.  Home Medications   Current Outpatient Rx  Name Route Sig Dispense Refill  . HYDROCOD POLST-CPM POLST ER 10-8 MG/5ML PO LQCR Oral Take 5 mLs by mouth 2 times daily at 12 noon and 4 pm. 100 mL 0  . PREDNISONE 10 MG PO TABS  6,5,4,3,2,1 - take with food 21 tablet 0  . PREDNISONE 10 MG PO TABS Oral Take 2 tablets (20 mg total) by mouth 2 (two) times daily. 15 tablet 0    BP 129/79  Pulse 86  Temp(Src) 98.5 F (36.9 C) (Oral)  Resp 20  Ht 5\' 6"  (1.676 m)  Wt 135 lb (61.236 kg)  BMI  21.79 kg/m2  SpO2 100%  Physical Exam  Nursing note and vitals reviewed. Constitutional: He is oriented to person, place, and time. He appears well-developed and well-nourished. No distress.  HENT:  Head: Normocephalic and atraumatic.  Cardiovascular: Normal rate, regular rhythm, normal heart sounds and intact distal pulses.   No murmur heard. Pulmonary/Chest: Effort normal and breath sounds normal.  Musculoskeletal: He exhibits tenderness. He exhibits no edema.       Lumbar back: He exhibits tenderness, bony  tenderness and pain. He exhibits normal range of motion, no swelling, no edema, no deformity, no laceration, no spasm and normal pulse.       Back:       ttp of the left lumbar paraspinal muscles and SI joint space.   Neurological: He is alert and oriented to person, place, and time. No sensory deficit. He exhibits normal muscle tone. Coordination normal.  Reflex Scores:      Patellar reflexes are 2+ on the right side and 2+ on the left side.      Achilles reflexes are 2+ on the right side and 2+ on the left side. Skin: Skin is warm and dry.    ED Course  Procedures (including critical care time)   Dg Lumbar Spine Complete  08/18/2011  *RADIOLOGY REPORT*  Clinical Data: 35 year old male with low back pain.  LUMBAR SPINE - COMPLETE 4+ VIEW  Comparison: 02/19/2011  Findings: Five non-rib bearing lumbar type vertebra are again identified. There is no evidence of fracture or subluxation. The disc spaces are maintained. There is no evidence of focal bony lesion or spondylolysis.  IMPRESSION: Unremarkable lumbar spine series.  Original Report Authenticated By: Rosendo Gros, M.D.     Patient also had MRI of lumbar spine from 01/2011 that showed Mild facet degenerative changes in the lower lumbar spine but no  focal disc protrusions, significant spinal or foraminal stenosis.   MDM   Previous ED charts, imaging and nursing notes were reviewed.    Patient has ttp of the lumbar paraspinal muscles and right SI joint.  No focal neuro deficits on exam.  Pain also reproduced with SLR on right.  Patient was seen here on 08/17/11 for same, advised to f/u with PMD but pt states they would not see him.    Pain likely related to sciatica.  Symptoms are chronic.   I have reviewed the patient on the [place] narcotics database.  Patient has been receiving narcotic prescriptions from his neurosurgeon.  I have discussed the pt hx and care plan with the EDP.       Dequavion Follette L. Lexa Coronado, [place] 09/06/11 0126

## 2011-09-06 NOTE — Discharge Instructions (Signed)
Chronic Back Pain When back pain lasts longer than 3 months, it is called chronic back pain.This pain can be frustrating, but the cause of the pain is rarely dangerous.People with chronic back pain often go through certain periods that are more intense (flare-ups). CAUSES Chronic back pain can be caused by wear and tear (degeneration) on different structures in your back. These structures may include bones, ligaments, or discs. This degeneration may result in more pressure being placed on the nerves that travel to your legs and feet. This can lead to pain traveling from the low back down the back of the legs. When pain lasts longer than 3 months, it is not unusual for people to experience anxiety or depression. Anxiety and depression can also contribute to low back pain. TREATMENT  Establish a regular exercise plan. This is critical to improving your functional level.   Have a self-management plan for when you flare-up. Flare-ups rarely require a medical visit. Regular exercise will help reduce the intensity and frequency of your flare-ups.   Manage how you feel about your back pain and the rest of your life. Anxiety, depression, and feeling that you cannot alter your back pain have been shown to make back pain more intense and debilitating.   Medicines should never be your only treatment. They should be used along with other treatments to help you return to a more active lifestyle.   Procedures such as injections or surgery may be helpful but are rarely necessary. You may be able to get the same results with physical therapy or chiropractic care.  HOME CARE INSTRUCTIONS  Avoid bending, heavy lifting, prolonged sitting, and activities which make the problem worse.   Continue normal activity as much as possible.   Take brief periods of rest throughout the day to reduce your pain during flare-ups.   Follow your back exercise rehabilitation program. This can help reduce symptoms and prevent  more pain.   Only take over-the-counter or prescription medicines as directed by your caregiver. Muscle relaxants are sometimes prescribed. Narcotic pain medicine is discouraged for long-term pain, since addiction is a possible outcome.   If you smoke, quit.   Eat healthy foods and maintain a recommended body weight.  SEEK IMMEDIATE MEDICAL CARE IF:   You have weakness or numbness in one of your legs or feet.   You have trouble controlling your bladder or bowels.   You develop nausea, vomiting, abdominal pain, shortness of breath, or fainting.  Document Released: 06/17/2004 Document Revised: 04/29/2011 Document Reviewed: 04/24/2011 Exit[employer] Patient Information 2012 [employer].Chronic Pain Management Managing chronic pain is not easy. The goal is to provide as much pain relief as possible. There are emotional as well as physical problems. Chronic pain may lead to symptoms of depression which magnify those of the pain. Problems may include:  Anxiety.   Sleep disturbances.   Confused thinking.   Feeling cranky.   Fatigue.   Weight gain or loss.  Identify the source of the pain first, if possible. The pain may be masking another problem. Try to find a pain management specialist or clinic. Work with a team to create a treatment plan for you. MEDICATIONS  May include narcotics or opioids. Larger than normal doses may be needed to control your pain.   Drugs for depression may help.   Over-the-counter medicines may help for some conditions. These drugs may be used along with others for better pain relief.   May be injected into sites such as the spine   and joints. Injections may have to be repeated if they wear off.  THERAPY MAY INCLUDE:  Working with a physical therapist to keep from getting stiff.   Regular, gentle exercise.   Cognitive or behavioral therapy.   Using complementary or integrative medicine such as:   Acupuncture.   Massage, Reiki, or Rolfing.    Aroma, color, light, or sound therapy.   Group support.  FOR MORE INFORMATION http://www.painfoundation.org. American Chronic Pain Association http://www.thealpa.org. Document Released: 06/17/2004 Document Revised: 04/29/2011 Document Reviewed: 07/27/2007 Exit[employer] Patient Information 2012 [employer]. 

## 2011-09-06 NOTE — ED Provider Notes (Signed)
Medical screening examination/treatment/procedure(s) were conducted as a shared visit with non-physician practitioner(s) and myself.  I personally evaluated the patient during the encounter. PT evaluated has lumbar tenderness  but no LE deficits. DTRs, strength and sensorium all intact and equal. Old records reviewed and has multiple narcotic prescriptions recently prescribed by his neurosurgeon Dr. Lovell Sheehan. X-ray reviewed no acute findings. Plan discharge home Flexeril and NSAIDs and follow up with his physician in patient states understanding strict return precautions for any weakness, numbness, incontinence or any worsening condition.   Sunnie Nielsen, MD 09/06/11 [phone]

## 2011-11-28 ENCOUNTER — Encounter (HOSPITAL_COMMUNITY): Payer: Self-pay

## 2011-11-28 ENCOUNTER — Emergency Department (HOSPITAL_COMMUNITY)
Admission: EM | Admit: 2011-11-28 | Discharge: 2011-11-28 | Disposition: A | Discharge status: Home / Self Care | Payer: Self-pay | Attending: Emergency Medicine | Admitting: Emergency Medicine

## 2011-11-28 DIAGNOSIS — M2550 Pain in unspecified joint: Secondary | ICD-10-CM | POA: Insufficient documentation

## 2011-11-28 DIAGNOSIS — F17200 Nicotine dependence, unspecified, uncomplicated: Secondary | ICD-10-CM | POA: Insufficient documentation

## 2011-11-28 DIAGNOSIS — M5380 Other specified dorsopathies, site unspecified: Secondary | ICD-10-CM | POA: Insufficient documentation

## 2011-11-28 DIAGNOSIS — M545 Low back pain, unspecified: Secondary | ICD-10-CM | POA: Insufficient documentation

## 2011-11-28 DIAGNOSIS — W010XXA Fall on same level from slipping, tripping and stumbling without subsequent striking against object, initial encounter: Secondary | ICD-10-CM | POA: Insufficient documentation

## 2011-11-28 MED ORDER — DIAZEPAM 5 MG PO TABS: 5.0000 mg | ORAL_TABLET | Freq: Two times a day (BID) | ORAL | Status: AC

## 2011-11-28 MED ORDER — HYDROCODONE-ACETAMINOPHEN 5-325 MG PO TABS: 1.0000 | ORAL_TABLET | ORAL | Status: AC | PRN

## 2011-11-28 MED ORDER — DIAZEPAM 5 MG/ML IJ SOLN
5.0000 mg | Freq: Once | INTRAMUSCULAR | Status: AC
  Administered 2011-11-28: 5 mg via INTRAMUSCULAR
  Filled 2011-11-28: qty 2

## 2011-11-28 MED ORDER — HYDROMORPHONE HCL PF 1 MG/ML IJ SOLN
1.0000 mg | Freq: Once | INTRAMUSCULAR | Status: AC
  Administered 2011-11-28: 1 mg via INTRAMUSCULAR
  Filled 2011-11-28: qty 1

## 2011-11-28 NOTE — ED Provider Notes (Signed)
History     CSN: [id number]  Arrival date & time 11/28/11  1649   First MD Initiated Contact with Patient 11/28/11 1824      Chief Complaint  Patient presents with  . Fall    (Consider location/radiation/quality/duration/timing/severity/associated sxs/prior treatment) HPI Comments: Patient here with right lower back pain after fall on Friday. States that he was walking on some concrete when there was a wet area and he slipped falling onto his right lower back and buttocks. Reports severe pain in in the lower back. Has tried tramadol for the pain without relief. Patient has a history of chronic lower back pain but states that he has no insurance and has not been seen by either her primary care or neurosurgery. He states that he normally takes ibuprofen for his pain. He denies weakness beyond the norm. States normally the right leg is slightly weaker than the left. Denies numbness or tingling. Also denies loss of control of bowels or bladder or urinary retention.  Patient is a 36 y.o. male presenting with fall. The history is provided by the patient. No language interpreter was used.  Fall The accident occurred 2 days ago. The fall occurred while walking. He fell from a height of 3 to 5 ft. He landed on a hard floor. There was no blood loss. Point of impact: Right lower back. Pain location: Right lower back. The pain is at a severity of 10/10. The pain is severe. He was ambulatory at the scene. There was no entrapment after the fall. There was no drug use involved in the accident. There was no alcohol use involved in the accident. Pertinent negatives include no visual change, no fever, no numbness, no abdominal pain, no bowel incontinence, no nausea, no vomiting, no hematuria, no headaches, no hearing loss, no loss of consciousness and no tingling. The symptoms are aggravated by activity.    Past Medical History  Diagnosis Date  . Chronic back pain     Past Surgical History  Procedure Date    . Arm surgery  left     tendon and laceration repair  . Shoulder surgery     No family history on file.  History  Substance Use Topics  . Smoking status: Current Everyday Smoker -- 1.5 packs/day    Types: Cigarettes  . Smokeless tobacco: Not on file  . Alcohol Use: No      Review of Systems  Constitutional: Negative for fever.  HENT: Negative for neck pain.   Eyes: Negative for pain.  Respiratory: Negative for chest tightness and shortness of breath.   Cardiovascular: Negative for chest pain.  Gastrointestinal: Negative for nausea, vomiting, abdominal pain and bowel incontinence.  Genitourinary: Negative for hematuria.  Musculoskeletal: Positive for back pain and arthralgias.  Neurological: Negative for tingling, loss of consciousness, numbness and headaches.  All other systems reviewed and are negative.    Allergies  Review of patient's allergies indicates no known allergies.  Home Medications   Current Outpatient Rx  Name Route Sig Dispense Refill  . TRAMADOL HCL 50 MG PO TABS Oral Take 50 mg by mouth every 6 (six) hours as needed. pain      BP 111/76  Pulse 77  Temp 98.8 F (37.1 C) (Oral)  Resp 16  SpO2 99%  Physical Exam  Nursing note and vitals reviewed. Constitutional: He is oriented to person, place, and time. He appears well-developed and well-nourished.       Uncomfortable appearing  HENT:  Head: Normocephalic and  atraumatic.  Right Ear: External ear normal.  Left Ear: External ear normal.  Nose: Nose normal.  Mouth/Throat: Oropharynx is clear and moist. No oropharyngeal exudate.  Eyes: Conjunctivae are normal. Pupils are equal, round, and reactive to light. No scleral icterus.  Neck: Normal range of motion. Neck supple.  Cardiovascular: Normal rate, regular rhythm and normal heart sounds.  Exam reveals no gallop and no friction rub.   No murmur heard. Pulmonary/Chest: Effort normal and breath sounds normal. No respiratory distress. He has no  wheezes. He has no rales. He exhibits no tenderness.  Abdominal: Soft. Bowel sounds are normal. He exhibits no distension. There is no tenderness.  Musculoskeletal:       Lumbar back: He exhibits decreased range of motion, tenderness, pain and spasm. He exhibits no bony tenderness and no deformity.       Back:  Lymphadenopathy:    He has no cervical adenopathy.  Neurological: He is alert and oriented to person, place, and time. No cranial nerve deficit. He exhibits normal muscle tone. Coordination normal.  Skin: Skin is warm and dry. No rash noted. No erythema. No pallor.  Psychiatric: He has a normal mood and affect. His behavior is normal. Judgment and thought content normal.    ED Course  Procedures (including critical care time)  Labs Reviewed - No data to display No results found.   Lower back pain   MDM  Patient here with acute on chronic lower back pain status post fall 2 days ago. This is worsening of his chronic back pain. There are no alarming signs to suggest cauda equina or epidural hematoma. Patient with normal neurological exam. Do not feel imaging is necessary at this point. We'll give a short course of pain medication and muscle relaxer. Patient is encouraged to followup with a primary care provider once his Medicaid is approved.        Izola Price [place], [place] 11/28/11 2050

## 2011-11-28 NOTE — ED Notes (Signed)
Pt reports falling Friday night landing on lower back- pt c/o of lower back pain that radiates to RLE- also c/o of rt shoulder pain- denies numbness and tingling.  Tramadol 50 mg taken at 11am without relief

## 2011-11-28 NOTE — ED Notes (Addendum)
Pt in with lower back pain states radiates down right leg from fall on Friday, also c/o neck and right shoulder pain from fall states hx of chronic back pain

## 2011-11-28 NOTE — Discharge Instructions (Signed)
Back Pain, Adult Low back pain is very common. About 1 in 5 people have back pain.The cause of low back pain is rarely dangerous. The pain often gets better over time.About half of people with a sudden onset of back pain feel better in just 2 weeks. About 8 in 10 people feel better by 6 weeks.  CAUSES Some common causes of back pain include:  Strain of the muscles or ligaments supporting the spine.   Wear and tear (degeneration) of the spinal discs.   Arthritis.   Direct injury to the back.  DIAGNOSIS Most of the time, the direct cause of low back pain is not known.However, back pain can be treated effectively even when the exact cause of the pain is unknown.Answering your caregiver's questions about your overall health and symptoms is one of the most accurate ways to make sure the cause of your pain is not dangerous. If your caregiver needs more information, he or she may order lab work or imaging tests (X-rays or MRIs).However, even if imaging tests show changes in your back, this usually does not require surgery. HOME CARE INSTRUCTIONS For many people, back pain returns.Since low back pain is rarely dangerous, it is often a condition that people can learn to [facility] their own.   Remain active. It is stressful on the back to sit or stand in one place. Do not sit, drive, or stand in one place for more than 30 minutes at a time. Take short walks on level surfaces as soon as pain allows.Try to increase the length of time you walk each day.   Do not stay in bed.Resting more than 1 or 2 days can delay your recovery.   Do not avoid exercise or work.Your body is made to move.It is not dangerous to be active, even though your back may hurt.Your back will likely heal faster if you return to being active before your pain is gone.   Pay attention to your body when you bend and lift. Many people have less discomfortwhen lifting if they bend their knees, keep the load close to their  bodies,and avoid twisting. Often, the most comfortable positions are those that put less stress on your recovering back.   Find a comfortable position to sleep. Use a firm mattress and lie on your side with your knees slightly bent. If you lie on your back, put a pillow under your knees.   Only take over-the-counter or prescription medicines as directed by your caregiver. Over-the-counter medicines to reduce pain and inflammation are often the most helpful.Your caregiver may prescribe muscle relaxant drugs.These medicines help dull your pain so you can more quickly return to your normal activities and healthy exercise.   Put ice on the injured area.   Put ice in a plastic bag.   Place a towel between your skin and the bag.   Leave the ice on for 15 to 20 minutes, 3 to 4 times a day for the first 2 to 3 days. After that, ice and heat may be alternated to reduce pain and spasms.   Ask your caregiver about trying back exercises and gentle massage. This may be of some benefit.   Avoid feeling anxious or stressed.Stress increases muscle tension and can worsen back pain.It is important to recognize when you are anxious or stressed and learn ways to manage it.Exercise is a great option.  SEEK MEDICAL CARE IF:  You have pain that is not relieved with rest or medicine.   You have  pain that does not improve in 1 week.   You have new symptoms.   You are generally not feeling well.  SEEK IMMEDIATE MEDICAL CARE IF:   You have pain that radiates from your back into your legs.   You develop new bowel or bladder control problems.   You have unusual weakness or numbness in your arms or legs.   You develop nausea or vomiting.   You develop abdominal pain.   You feel faint.  Document Released: 05/10/2005 Document Revised: 04/29/2011 Document Reviewed: 09/28/2010 [facility] Patient Information 2012 [place], [place].Chronic Back Pain When back pain lasts longer than 3 months, it is called  chronic back pain.This pain can be frustrating, but the cause of the pain is rarely dangerous.People with chronic back pain often go through certain periods that are more intense (flare-ups). CAUSES Chronic back pain can be caused by wear and tear (degeneration) on different structures in your back. These structures may include bones, ligaments, or discs. This degeneration may result in more pressure being placed on the nerves that travel to your legs and feet. This can lead to pain traveling from the low back down the back of the legs. When pain lasts longer than 3 months, it is not unusual for people to experience anxiety or depression. Anxiety and depression can also contribute to low back pain. TREATMENT  Establish a regular exercise plan. This is critical to improving your functional level.   Have a self-management plan for when you flare-up. Flare-ups rarely require a medical visit. Regular exercise will help reduce the intensity and frequency of your flare-ups.   Manage how you feel about your back pain and the rest of your life. Anxiety, depression, and feeling that you cannot alter your back pain have been shown to make back pain more intense and debilitating.   Medicines should never be your only treatment. They should be used along with other treatments to help you return to a more active lifestyle.   Procedures such as injections or surgery may be helpful but are rarely necessary. You may be able to get the same results with physical therapy or chiropractic care.  HOME CARE INSTRUCTIONS  Avoid bending, heavy lifting, prolonged sitting, and activities which make the problem worse.   Continue normal activity as much as possible.   Take brief periods of rest throughout the day to reduce your pain during flare-ups.   Follow your back exercise rehabilitation program. This can help reduce symptoms and prevent more pain.   Only take over-the-counter or prescription medicines as  directed by your caregiver. Muscle relaxants are sometimes prescribed. Narcotic pain medicine is discouraged for long-term pain, since addiction is a possible outcome.   If you smoke, quit.   Eat healthy foods and maintain a recommended body weight.  SEEK IMMEDIATE MEDICAL CARE IF:   You have weakness or numbness in one of your legs or feet.   You have trouble controlling your bladder or bowels.   You develop nausea, vomiting, abdominal pain, shortness of breath, or fainting.  Document Released: 06/17/2004 Document Revised: 04/29/2011 Document Reviewed: 04/24/2011 [facility] Patient Information 2012 [place], [place].

## 2011-11-29 NOTE — ED Provider Notes (Signed)
Medical screening examination/treatment/procedure(s) were performed by non-physician practitioner and as supervising physician I was immediately available for consultation/collaboration.  Hurman Horn, MD 11/29/11 [id number]

## 2012-04-25 ENCOUNTER — Emergency Department (HOSPITAL_COMMUNITY)
Admission: EM | Admit: 2012-04-25 | Discharge: 2012-04-25 | Disposition: A | Discharge status: Home / Self Care | Payer: Self-pay | Attending: Emergency Medicine | Admitting: Emergency Medicine

## 2012-04-25 ENCOUNTER — Encounter (HOSPITAL_COMMUNITY): Payer: Self-pay | Admitting: *Deleted

## 2012-04-25 ENCOUNTER — Emergency Department (HOSPITAL_COMMUNITY): Payer: Self-pay

## 2012-04-25 DIAGNOSIS — R109 Unspecified abdominal pain: Secondary | ICD-10-CM | POA: Insufficient documentation

## 2012-04-25 DIAGNOSIS — M25521 Pain in right elbow: Secondary | ICD-10-CM

## 2012-04-25 DIAGNOSIS — M25551 Pain in right hip: Secondary | ICD-10-CM

## 2012-04-25 DIAGNOSIS — M25559 Pain in unspecified hip: Secondary | ICD-10-CM | POA: Insufficient documentation

## 2012-04-25 DIAGNOSIS — M545 Low back pain: Secondary | ICD-10-CM

## 2012-04-25 DIAGNOSIS — R300 Dysuria: Secondary | ICD-10-CM | POA: Insufficient documentation

## 2012-04-25 DIAGNOSIS — Z9889 Other specified postprocedural states: Secondary | ICD-10-CM | POA: Insufficient documentation

## 2012-04-25 DIAGNOSIS — F17200 Nicotine dependence, unspecified, uncomplicated: Secondary | ICD-10-CM | POA: Insufficient documentation

## 2012-04-25 DIAGNOSIS — M25511 Pain in right shoulder: Secondary | ICD-10-CM

## 2012-04-25 DIAGNOSIS — M25519 Pain in unspecified shoulder: Secondary | ICD-10-CM | POA: Insufficient documentation

## 2012-04-25 DIAGNOSIS — W19XXXA Unspecified fall, initial encounter: Secondary | ICD-10-CM

## 2012-04-25 DIAGNOSIS — M25529 Pain in unspecified elbow: Secondary | ICD-10-CM | POA: Insufficient documentation

## 2012-04-25 LAB — URINALYSIS, ROUTINE W REFLEX MICROSCOPIC
Bilirubin Urine: NEGATIVE
Glucose, UA: NEGATIVE mg/dL
Hgb urine dipstick: NEGATIVE
Ketones, ur: NEGATIVE mg/dL
Protein, ur: NEGATIVE mg/dL

## 2012-04-25 MED ORDER — NAPROXEN 500 MG PO TABS: 500.0000 mg | ORAL_TABLET | Freq: Two times a day (BID) | ORAL | Status: DC

## 2012-04-25 MED ORDER — OXYCODONE-ACETAMINOPHEN 5-325 MG PO TABS: 2.0000 | ORAL_TABLET | ORAL | Status: DC | PRN

## 2012-04-25 MED ORDER — SODIUM CHLORIDE 0.9 % IV BOLUS (SEPSIS)
500.0000 mL | Freq: Once | INTRAVENOUS | Status: AC
  Administered 2012-04-25: 500 mL via INTRAVENOUS

## 2012-04-25 MED ORDER — CYCLOBENZAPRINE HCL 10 MG PO TABS: 10.0000 mg | ORAL_TABLET | Freq: Two times a day (BID) | ORAL | Status: DC | PRN

## 2012-04-25 MED ORDER — ONDANSETRON HCL 4 MG/2ML IJ SOLN
4.0000 mg | Freq: Once | INTRAMUSCULAR | Status: AC
  Administered 2012-04-25: 4 mg via INTRAVENOUS
  Filled 2012-04-25: qty 2

## 2012-04-25 MED ORDER — KETOROLAC TROMETHAMINE 60 MG/2ML IM SOLN
60.0000 mg | Freq: Once | INTRAMUSCULAR | Status: AC
  Administered 2012-04-25: 60 mg via INTRAMUSCULAR
  Filled 2012-04-25: qty 2

## 2012-04-25 MED ORDER — HYDROMORPHONE HCL PF 1 MG/ML IJ SOLN
0.5000 mg | Freq: Once | INTRAMUSCULAR | Status: AC
  Administered 2012-04-25: 0.5 mg via INTRAVENOUS
  Filled 2012-04-25: qty 1

## 2012-04-25 MED ORDER — HYDROMORPHONE HCL PF 1 MG/ML IJ SOLN
1.0000 mg | Freq: Once | INTRAMUSCULAR | Status: AC
  Administered 2012-04-25: 1 mg via INTRAVENOUS
  Filled 2012-04-25: qty 1

## 2012-04-25 NOTE — ED Provider Notes (Signed)
History  This chart was scribed for Donnetta Hutching, MD by Erskine Emery, ED Scribe. This patient was seen in room APA06/APA06 and the patient's care was started at 18:32.   CSN: [id number]  Arrival date & time 04/25/12  1811   First MD Initiated Contact with Patient 04/25/12 1832      Chief Complaint  Patient presents with  . Back Pain    (Consider location/radiation/quality/duration/timing/severity/associated sxs/prior treatment) The history is provided by the patient. No language interpreter was used.  Ronnie Wilson is a 36 y.o. male who presents to the Emergency Department complaining of right-sided lower back pain, shoulder pain, elbow pain, hip pain that radiates down the right leg, and difficulty moving the right arm for the past 2 days. Pt's wife reports around 4:30am this morning he was complaining of left flank pain and had dysuria, darkened urine, and hematuria. Pt has also had numbness and tingling in the right leg for the past couple weeks. Pt reports he is able to urinate and have bowel movements, but they have been abnormal. Pt urinated only a couple times today, which is much less than usual. Pt reports he has been having difficulty with his back and his legs giving out for years but a recent fall aggravated the symptoms. Pt is not able to ambulate with ease. Pt has NKDA.  Currently, the pt has no PCP. He was seeing Dr. Lovell Sheehan, who wanted him to do an MRI, but he couldn't afford it since he is now out of work. The pt used to be an Personnel officer.  Past Medical History  Diagnosis Date  . Chronic back pain     Past Surgical History  Procedure Date  . Arm surgery  left     tendon and laceration repair  . Shoulder surgery     History reviewed. No pertinent family history.  History  Substance Use Topics  . Smoking status: Current Every Day Smoker -- 1.5 packs/day    Types: Cigarettes  . Smokeless tobacco: Not on file  . Alcohol Use: No      Review of Systems A  complete 10 system review of systems was obtained and all systems are negative except as noted in the HPI and PMH.    Allergies  Review of patient's allergies indicates no known allergies.  Home Medications   Current Outpatient Rx  Name  Route  Sig  Dispense  Refill  . TRAMADOL HCL 50 MG PO TABS   Oral   Take 50 mg by mouth every 6 (six) hours as needed. pain           Triage Vitals: BP 127/66  Pulse 90  Temp 98.7 F (37.1 C) (Oral)  Resp 18  Ht 5\' 5"  (1.651 m)  Wt 130 lb (58.968 kg)  BMI 21.63 kg/m2  SpO2 99%  Physical Exam  Nursing note and vitals reviewed. Constitutional: He is oriented to person, place, and time. He appears well-developed and well-nourished.  HENT:  Head: Normocephalic and atraumatic.  Eyes: Conjunctivae normal and EOM are normal. Pupils are equal, round, and reactive to light.  Neck: Normal range of motion. Neck supple.  Cardiovascular: Normal rate, regular rhythm and normal heart sounds.   Pulmonary/Chest: Effort normal and breath sounds normal.  Abdominal: Soft. Bowel sounds are normal.  Musculoskeletal: Normal range of motion.       Tenderness generally in the right shoulder, elbow, hip, and lower back. Pain upon lifting the right leg but not left leg.  Neurological: He is alert and oriented to person, place, and time.  Skin: Skin is warm and dry.  Psychiatric: He has a normal mood and affect.    ED Course  Procedures (including critical care time) DIAGNOSTIC STUDIES: Oxygen Saturation is 99% on room air, normal by my interpretation.    COORDINATION OF CARE: 18:46--I evaluated the patient and we discussed a treatment plan including pain medication, urinalysis, and x-rays of the right shoulder, lumbar spine, elbow, and hip to which the pt agreed.    Results for orders placed during the hospital encounter of 04/25/12  URINALYSIS, ROUTINE W REFLEX MICROSCOPIC      Component Value Range   Color, Urine YELLOW  YELLOW   APPearance CLEAR   CLEAR   Specific Gravity, Urine <1.005 (*) 1.005 - 1.030   pH 5.5  5.0 - 8.0   Glucose, UA NEGATIVE  NEGATIVE mg/dL   Hgb urine dipstick NEGATIVE  NEGATIVE   Bilirubin Urine NEGATIVE  NEGATIVE   Ketones, ur NEGATIVE  NEGATIVE mg/dL   Protein, ur NEGATIVE  NEGATIVE mg/dL   Urobilinogen, UA 0.2  0.0 - 1.0 mg/dL   Nitrite NEGATIVE  NEGATIVE   Leukocytes, UA NEGATIVE  NEGATIVE   Dg Lumbar Spine Complete  04/25/2012  *RADIOLOGY REPORT*  Clinical Data: Right low back pain for 2 days.  No known injury.  LUMBAR SPINE - COMPLETE 4+ VIEW  Comparison: Lumbar spine radiographs 09/06/2011 and 08/18/2011.  Findings: There are five lumbar type vertebral bodies.  The alignment is normal.  The disc spaces are preserved.  There is no evidence of acute fracture or pars defect.  Pelvic calcifications are unchanged, consistent with phleboliths.  IMPRESSION: Stable examination.  No acute osseous findings or malalignment.   Original Report Authenticated By: Carey Bullocks, M.D.    Dg Shoulder Right  04/25/2012  *RADIOLOGY REPORT*  Clinical Data: Right shoulder pain for 2 days.  No known injury.  RIGHT SHOULDER - 2+ VIEW  Comparison: None.  Findings: The mineralization and alignment are normal.  There is no evidence of acute fracture or dislocation.  The subacromial space is preserved.  Mild facet disease is noted in the lower cervical spine.  IMPRESSION: No acute osseous findings.   Original Report Authenticated By: Carey Bullocks, M.D.    Dg Elbow Complete Right  04/25/2012  *RADIOLOGY REPORT*  Clinical Data: Right elbow pain for 2 days.  No known injury.  RIGHT ELBOW - COMPLETE 3+ VIEW  Comparison: None.  Findings: The mineralization and alignment are normal.  There is no evidence of acute fracture or dislocation.  There is no elbow joint effusion.  The joint spaces are maintained.  IMPRESSION: No acute osseous findings.   Original Report Authenticated By: Carey Bullocks, M.D.    Dg Hip Complete Right  04/25/2012   *RADIOLOGY REPORT*  Clinical Data: Right hip pain for 2 days.No known injury.  RIGHT HIP - COMPLETE 2+ VIEW  Comparison: None.  Findings: The mineralization and alignment are normal.  There is no evidence of acute fracture or dislocation.  The hip joint spaces are preserved.  There is no evidence of femoral head avascular necrosis.  IMPRESSION: No acute osseous findings.   Original Report Authenticated By: Carey Bullocks, M.D.       No diagnosis found.    MDM  X-rays of right shoulder, right elbow, right hip, lumbar spinae all negative.  Discharge meds include Flexeril 10 mg #20. Naprosyn 375 mg #20 and Percocet #10  I personally performed the services described in this documentation, which was scribed in my presence. The recorded information has been reviewed and is accurate.    Donnetta Hutching, MD 04/25/12 2138

## 2012-04-25 NOTE — ED Notes (Addendum)
Back pain, unable to move rt arm well for 2 days.dysuria for 1 week.Numbness and tingling of rt leg for over 1 week.  Alert, talking

## 2012-06-12 ENCOUNTER — Encounter (HOSPITAL_COMMUNITY): Payer: Self-pay | Admitting: *Deleted

## 2012-06-12 ENCOUNTER — Emergency Department (HOSPITAL_COMMUNITY)
Admission: EM | Admit: 2012-06-12 | Discharge: 2012-06-12 | Disposition: A | Discharge status: Home / Self Care | Payer: Self-pay | Attending: Emergency Medicine | Admitting: Emergency Medicine

## 2012-06-12 DIAGNOSIS — M545 Low back pain, unspecified: Secondary | ICD-10-CM | POA: Insufficient documentation

## 2012-06-12 DIAGNOSIS — R509 Fever, unspecified: Secondary | ICD-10-CM | POA: Insufficient documentation

## 2012-06-12 DIAGNOSIS — Y929 Unspecified place or not applicable: Secondary | ICD-10-CM | POA: Insufficient documentation

## 2012-06-12 DIAGNOSIS — F17200 Nicotine dependence, unspecified, uncomplicated: Secondary | ICD-10-CM | POA: Insufficient documentation

## 2012-06-12 DIAGNOSIS — S335XXA Sprain of ligaments of lumbar spine, initial encounter: Secondary | ICD-10-CM | POA: Insufficient documentation

## 2012-06-12 DIAGNOSIS — R109 Unspecified abdominal pain: Secondary | ICD-10-CM | POA: Insufficient documentation

## 2012-06-12 DIAGNOSIS — Y939 Activity, unspecified: Secondary | ICD-10-CM | POA: Insufficient documentation

## 2012-06-12 DIAGNOSIS — K5900 Constipation, unspecified: Secondary | ICD-10-CM | POA: Insufficient documentation

## 2012-06-12 DIAGNOSIS — S39012A Strain of muscle, fascia and tendon of lower back, initial encounter: Secondary | ICD-10-CM

## 2012-06-12 DIAGNOSIS — X58XXXA Exposure to other specified factors, initial encounter: Secondary | ICD-10-CM | POA: Insufficient documentation

## 2012-06-12 DIAGNOSIS — G8929 Other chronic pain: Secondary | ICD-10-CM | POA: Insufficient documentation

## 2012-06-12 LAB — URINALYSIS, ROUTINE W REFLEX MICROSCOPIC
Glucose, UA: NEGATIVE mg/dL
Ketones, ur: NEGATIVE mg/dL
Leukocytes, UA: NEGATIVE
pH: 6 (ref 5.0–8.0)

## 2012-06-12 MED ORDER — METHOCARBAMOL 500 MG PO TABS: 500.0000 mg | ORAL_TABLET | Freq: Two times a day (BID) | ORAL | Status: DC

## 2012-06-12 MED ORDER — IBUPROFEN 600 MG PO TABS: 600.0000 mg | ORAL_TABLET | Freq: Four times a day (QID) | ORAL | Status: DC | PRN

## 2012-06-12 NOTE — ED Provider Notes (Signed)
History   This chart was scribed for Toy Baker, MD by Charolett Bumpers, ED Scribe. The patient was seen in room APA18/APA18. Patient's care was started at 2216.   CSN: [id number]  Arrival date & time 06/12/12  2112   First MD Initiated Contact with Patient 06/12/12 2216      Chief Complaint  Patient presents with  . Dysuria    The history is provided by the patient. No language interpreter was used.   Ronnie Wilson is a 37 y.o. male who presents to the Emergency Department complaining of severe sharp lower back pain with lower abdominal pain for the past 3 days. He reports associated subjective fever, dysuria and constipation. He denies any recent injuries or falls. He states that he hasn't had a BM in the 3 weeks. He states that his symptoms are aggravated with coughing. He states that he has a lot of cramping/sharp pain in his abdomen when he tries to move his bowels. He reports a chronic problem sciatica and back pain.   Past Medical History  Diagnosis Date  . Chronic back pain     Past Surgical History  Procedure Date  . Arm surgery  left     tendon and laceration repair  . Shoulder surgery     History reviewed. No pertinent family history.  History  Substance Use Topics  . Smoking status: Current Every Day Smoker -- 1.5 packs/day    Types: Cigarettes  . Smokeless tobacco: Not on file  . Alcohol Use: No      Review of Systems  Constitutional: Positive for fever.  Gastrointestinal: Positive for abdominal pain and constipation.  Genitourinary: Positive for dysuria.  Musculoskeletal: Positive for back pain.  All other systems reviewed and are negative.    Allergies  Review of patient's allergies indicates no known allergies.  Home Medications  No current outpatient prescriptions on file.  BP 138/83  Pulse 95  Temp 97.8 F (36.6 C) (Oral)  Resp 20  Ht 5\' 5"  (1.651 m)  Wt 140 lb (63.504 kg)  BMI 23.30 kg/m2  SpO2 100%  Physical Exam    Nursing note and vitals reviewed. Constitutional: He is oriented to person, place, and time. He appears well-developed and well-nourished.  Non-toxic appearance. No distress.  HENT:  Head: Normocephalic and atraumatic.  Eyes: Conjunctivae normal, EOM and lids are normal. Pupils are equal, round, and reactive to light.  Neck: Normal range of motion. Neck supple. No tracheal deviation present. No mass present.  Cardiovascular: Normal rate, regular rhythm and normal heart sounds.  Exam reveals no gallop.   No murmur heard. Pulmonary/Chest: Effort normal and breath sounds normal. No stridor. No respiratory distress. He has no decreased breath sounds. He has no wheezes. He has no rhonchi. He has no rales.  Abdominal: Soft. Normal appearance and bowel sounds are normal. He exhibits no distension and no mass. There is no tenderness. There is no rebound, no guarding and no CVA tenderness.       Bilateral inguinal canals are normal, no signs of masses.   Musculoskeletal: Normal range of motion. He exhibits no edema and no tenderness.       Tenderness at the lumbar paraspinal area.   Neurological: He is alert and oriented to person, place, and time. He has normal strength. No cranial nerve deficit or sensory deficit. GCS eye subscore is 4. GCS verbal subscore is 5. GCS motor subscore is 6.       Normal SLR  bilaterally and normal 2+ patellar reflexes.   Skin: Skin is warm and dry. No abrasion and no rash noted.  Psychiatric: He has a normal mood and affect. His speech is normal and behavior is normal.    ED Course  Procedures (including critical care time)  DIAGNOSTIC STUDIES: Oxygen Saturation is 100% on room air, normal by my interpretation.    COORDINATION OF CARE:  22:25-Discussed planned course of treatment with the patient including a muscle relaxer and anti-inflammatories, who is agreeable at this time.   Results for orders placed during the hospital encounter of 06/12/12  URINALYSIS,  ROUTINE W REFLEX MICROSCOPIC      Component Value Range   Color, Urine YELLOW  YELLOW   APPearance CLEAR  CLEAR   Specific Gravity, Urine <1.005 (*) 1.005 - 1.030   pH 6.0  5.0 - 8.0   Glucose, UA NEGATIVE  NEGATIVE mg/dL   Hgb urine dipstick NEGATIVE  NEGATIVE   Bilirubin Urine NEGATIVE  NEGATIVE   Ketones, ur NEGATIVE  NEGATIVE mg/dL   Protein, ur NEGATIVE  NEGATIVE mg/dL   Urobilinogen, UA 0.2  0.0 - 1.0 mg/dL   Nitrite NEGATIVE  NEGATIVE   Leukocytes, UA NEGATIVE  NEGATIVE    No results found.   No diagnosis found.    MDM  No concern for cauda equina or any severe process at this time. Suspect that patient has musculoskeletal strain. His neurological exam is normal. His abdominal exam is without signs of obstruction or distention. Patient placed on anti-inflammatories and muscle relaxants   I personally performed the services described in this documentation, which was scribed in my presence. The recorded information has been reviewed and is accurate.        Toy Baker, MD 06/12/12 2230

## 2012-06-12 NOTE — ED Notes (Signed)
Pt discharged. Pt stable at time of discharge. Medications reviewed pt has no questions regarding discharge at this time. Pt voiced understanding of discharge instructions.  

## 2012-06-12 NOTE — ED Notes (Signed)
Dysuria for 3 days,  With foul odor.  Low abd and low back pain,

## 2012-07-11 ENCOUNTER — Emergency Department (HOSPITAL_COMMUNITY)
Admission: EM | Admit: 2012-07-11 | Discharge: 2012-07-11 | Disposition: A | Discharge status: Home / Self Care | Payer: Self-pay | Attending: Emergency Medicine | Admitting: Emergency Medicine

## 2012-07-11 ENCOUNTER — Encounter (HOSPITAL_COMMUNITY): Payer: Self-pay | Admitting: *Deleted

## 2012-07-11 DIAGNOSIS — Y9289 Other specified places as the place of occurrence of the external cause: Secondary | ICD-10-CM | POA: Insufficient documentation

## 2012-07-11 DIAGNOSIS — F17200 Nicotine dependence, unspecified, uncomplicated: Secondary | ICD-10-CM | POA: Insufficient documentation

## 2012-07-11 DIAGNOSIS — Y9389 Activity, other specified: Secondary | ICD-10-CM | POA: Insufficient documentation

## 2012-07-11 DIAGNOSIS — R296 Repeated falls: Secondary | ICD-10-CM | POA: Insufficient documentation

## 2012-07-11 DIAGNOSIS — S335XXA Sprain of ligaments of lumbar spine, initial encounter: Secondary | ICD-10-CM | POA: Insufficient documentation

## 2012-07-11 DIAGNOSIS — S39012A Strain of muscle, fascia and tendon of lower back, initial encounter: Secondary | ICD-10-CM

## 2012-07-11 DIAGNOSIS — G8929 Other chronic pain: Secondary | ICD-10-CM | POA: Insufficient documentation

## 2012-07-11 MED ORDER — NAPROXEN 500 MG PO TABS: 500.0000 mg | ORAL_TABLET | Freq: Two times a day (BID) | ORAL | Status: DC

## 2012-07-11 MED ORDER — PREDNISONE 10 MG PO TABS: ORAL_TABLET | ORAL | Status: DC

## 2012-07-11 MED ORDER — KETOROLAC TROMETHAMINE 60 MG/2ML IM SOLN
60.0000 mg | Freq: Once | INTRAMUSCULAR | Status: AC
  Administered 2012-07-11: 60 mg via INTRAMUSCULAR
  Filled 2012-07-11: qty 2

## 2012-07-11 MED ORDER — OXYCODONE-ACETAMINOPHEN 5-325 MG PO TABS
1.0000 | ORAL_TABLET | Freq: Once | ORAL | Status: AC
  Administered 2012-07-11: 1 via ORAL
  Filled 2012-07-11: qty 1

## 2012-07-11 MED ORDER — CYCLOBENZAPRINE HCL 10 MG PO TABS: 10.0000 mg | ORAL_TABLET | Freq: Two times a day (BID) | ORAL | Status: DC | PRN

## 2012-07-11 NOTE — ED Notes (Signed)
Pt helping friends move over the wkend; fell on back; c/o neck/back/right knee pain

## 2012-07-11 NOTE — ED Provider Notes (Signed)
History    This chart was scribed for Iona Coach, a non-physician practitioner working with Toy Baker, MD by Lewanda Rife, ED Scribe. This patient was seen in room WTR5/WTR5 and the patient's care was started at 2052.     CSN: [id number]  Arrival date & time 07/11/12  1941   First MD Initiated Contact with Patient 07/11/12 2032      Chief Complaint  Patient presents with  . Back Pain    (Consider location/radiation/quality/duration/timing/severity/associated sxs/prior treatment) HPI Ronnie Wilson is a 37 y.o. male who presents to the Emergency Department complaining of chronic lower back pain and exacerbation onset 4 days ago after falling on his back from helping a friend move a piano. Pt reports pain radiates down right leg. Pt denies weakness, and paresthesias. Pt denies urinary and bowel incontinence. Pt reports trying Aleve and Ibuprofen at home with no relief of symptoms. Pt reports history of chronic back pain.   Past Medical History  Diagnosis Date  . Chronic back pain     Past Surgical History  Procedure Laterality Date  . Arm surgery  left      tendon and laceration repair  . Shoulder surgery      No family history on file.  History  Substance Use Topics  . Smoking status: Current Every Day Smoker -- 1.50 packs/day    Types: Cigarettes  . Smokeless tobacco: Not on file  . Alcohol Use: No      Review of Systems  Musculoskeletal: Positive for back pain.  All other systems reviewed and are negative.   A complete 10 system review of systems was obtained and all systems are negative except as noted in the HPI and PMH.    Allergies  Review of patient's allergies indicates no known allergies.  Home Medications   Current Outpatient Rx  Name  Route  Sig  Dispense  Refill  . ibuprofen (ADVIL,MOTRIN) 600 MG tablet   Oral   Take 1 tablet (600 mg total) by mouth every 6 (six) hours as needed for pain.   30 tablet   0     BP  130/75  Pulse 100  Temp(Src) 99.2 F (37.3 C) (Oral)  Resp 14  SpO2 99%  Physical Exam  Nursing note and vitals reviewed. Constitutional: He appears well-developed and well-nourished. No distress.  HENT:  Head: Normocephalic and atraumatic.  Mouth/Throat: Oropharynx is clear and moist. No oropharyngeal exudate.  Eyes: Conjunctivae are normal. No scleral icterus.  Neck: Normal range of motion. Neck supple.  Cardiovascular: Normal rate, regular rhythm, normal heart sounds and intact distal pulses.  Exam reveals no gallop and no friction rub.   No murmur heard. Pulmonary/Chest: Effort normal and breath sounds normal. No respiratory distress. He has no wheezes.  Abdominal: Soft.  Musculoskeletal: He exhibits no edema.       Cervical back: Normal.       Thoracic back: Normal.       Lumbar back: He exhibits tenderness, bony tenderness and pain. He exhibits no swelling, no edema, no deformity, no laceration and no spasm.       Back:  Pain with right straight leg test. Decreased sensation to right thigh.   Neurological: He is alert. He has normal strength and normal reflexes. A sensory deficit (mild subjective decreased sensation to right thigh) is present. No cranial nerve deficit. GCS eye subscore is 4. GCS verbal subscore is 5. GCS motor subscore is 6.  Reflex Scores:  Patellar reflexes are 2+ on the right side and 2+ on the left side. Speech is clear and goal oriented, follows commands.Normal strength in upper and lower extremities bilaterally. Sensation normal to light touch bilaterally  Moves extremities without ataxia, coordination intact Normal balance   Skin: Skin is warm and dry. No rash noted. He is not diaphoretic.  Psychiatric: He has a normal mood and affect.    ED Course  Procedures (including critical care time) Medications  oxyCODONE-acetaminophen (PERCOCET/ROXICET) 5-325 MG per tablet 1 tablet (1 tablet Oral Given 07/11/12 2125)  ketorolac (TORADOL) injection 60  mg (60 mg Intramuscular Given 07/11/12 2125)    Labs Reviewed - No data to display No results found.   1. Lumbar strain       MDM  Pt with chronic back pain. Was moving a piano? Fell. No new neuro deficits. Has not seen pcp or a specialist for back pain due to no insurance. Negative MRI 4 months ago. Exam does not suggest cauda equina. Pt here for back pain multiple visits. Today will treat with NSAIDs, flexeril, prednisone course at home, follow up. Given percocet and toradol 60mg  IM in ED. Resources given for follow up.   Filed Vitals:   07/11/12 1953 07/11/12 2137  BP: 130/75 126/71  Pulse: 100 80  Temp: 99.2 F (37.3 C)   TempSrc: Oral   Resp: 14 20  SpO2: 99% 100%        I personally performed the services described in this documentation, which was scribed in my presence. The recorded information has been reviewed and is accurate.    Lottie Mussel, PA 07/12/12 0221

## 2012-07-12 NOTE — ED Provider Notes (Signed)
Medical screening examination/treatment/procedure(s) were performed by non-physician practitioner and as supervising physician I was immediately available for consultation/collaboration.  Toy Baker, MD 07/12/12 [phone]

## 2013-02-15 ENCOUNTER — Emergency Department (HOSPITAL_COMMUNITY): Payer: Self-pay

## 2013-02-15 ENCOUNTER — Encounter (HOSPITAL_COMMUNITY): Payer: Self-pay | Admitting: Emergency Medicine

## 2013-02-15 ENCOUNTER — Emergency Department (HOSPITAL_COMMUNITY)
Admission: EM | Admit: 2013-02-15 | Discharge: 2013-02-15 | Disposition: A | Discharge status: Home / Self Care | Payer: Self-pay | Attending: Emergency Medicine | Admitting: Emergency Medicine

## 2013-02-15 DIAGNOSIS — Z791 Long term (current) use of non-steroidal anti-inflammatories (NSAID): Secondary | ICD-10-CM | POA: Insufficient documentation

## 2013-02-15 DIAGNOSIS — F17200 Nicotine dependence, unspecified, uncomplicated: Secondary | ICD-10-CM | POA: Insufficient documentation

## 2013-02-15 DIAGNOSIS — S93609A Unspecified sprain of unspecified foot, initial encounter: Secondary | ICD-10-CM | POA: Insufficient documentation

## 2013-02-15 DIAGNOSIS — Y9289 Other specified places as the place of occurrence of the external cause: Secondary | ICD-10-CM | POA: Insufficient documentation

## 2013-02-15 DIAGNOSIS — G8929 Other chronic pain: Secondary | ICD-10-CM | POA: Insufficient documentation

## 2013-02-15 DIAGNOSIS — S93601A Unspecified sprain of right foot, initial encounter: Secondary | ICD-10-CM

## 2013-02-15 DIAGNOSIS — X500XXA Overexertion from strenuous movement or load, initial encounter: Secondary | ICD-10-CM | POA: Insufficient documentation

## 2013-02-15 DIAGNOSIS — Y9302 Activity, running: Secondary | ICD-10-CM | POA: Insufficient documentation

## 2013-02-15 MED ORDER — HYDROCODONE-ACETAMINOPHEN 5-325 MG PO TABS: 1.0000 | ORAL_TABLET | ORAL | Status: DC | PRN

## 2013-02-15 MED ORDER — HYDROCODONE-ACETAMINOPHEN 5-325 MG PO TABS
1.0000 | ORAL_TABLET | Freq: Once | ORAL | Status: AC
  Administered 2013-02-15: 1 via ORAL
  Filled 2013-02-15: qty 1

## 2013-02-15 NOTE — ED Notes (Signed)
Pain in right heel for one week.  Running without shoes and felt a sudden pain in foot.  No improvement over past week.  Cannot bear weight due to pain

## 2013-02-15 NOTE — ED Provider Notes (Signed)
CSN: [id number]     Arrival date & time 02/15/13  2115 History   First MD Initiated Contact with Patient 02/15/13 2124     Chief Complaint  Patient presents with  . Foot Injury   (Consider location/radiation/quality/duration/timing/severity/associated sxs/prior Treatment) Patient is a 37 y.o. male presenting with foot injury. The history is provided by the patient.  Foot Injury Location:  Foot Time since incident:  1 week Injury: yes   Foot location:  R foot Pain details:    Quality:  Burning, shooting and throbbing   Severity:  Moderate   Onset quality:  Sudden   Duration:  1 week   Timing:  Constant   Progression:  Worsening Chronicity:  New Dislocation: no   Foreign body present:  No foreign bodies Prior injury to area:  No Relieved by:  Nothing Worsened by:  Bearing weight Ineffective treatments:  NSAIDs and elevation Associated symptoms: no neck pain    Ronnie Wilson is a 37 y.o. male who presents to the ED with right heel pain. The pain started one week ago while he was running without shoes. He felt a sudden pain in his heel like a pop. Pain with any weight bearing. 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   NOTE: patient's wife began talking about the patient having an episode of burning in his chest last week and I ask the patient if he wanted to be worked up for chest pain. He denies any chest pain, nausea or vomiting or shortness of breath at this time. States he just wants his foot taken care of. Past Medical History  Diagnosis Date  . Chronic back pain    Past Surgical History  Procedure Laterality Date  . Arm surgery  left      tendon and laceration repair  . Shoulder surgery     No family history on file. History  Substance Use Topics  . Smoking status: Current Every Day Smoker -- 1.50 packs/day    Types: Cigarettes  . Smokeless tobacco: Not on file  . Alcohol Use: No    Review of Systems  Constitutional:  Negative for chills.  HENT: Negative for ear pain, sore throat and neck pain.   Respiratory: Negative for shortness of breath.   Cardiovascular: Negative for chest pain.  Gastrointestinal: Negative for nausea, vomiting and abdominal pain.  Musculoskeletal:       Right foot pain   Skin: Negative for wound.  Neurological: Negative for dizziness, syncope and headaches.  Psychiatric/Behavioral: The patient is not nervous/anxious.     Allergies  Review of patient's allergies indicates no known allergies. BP 113/67  Pulse 90  Temp(Src) 98.9 F (37.2 C) (Oral)  Resp 20  Ht 5\' 7"  (1.702 m)  Wt 130 lb (58.968 kg)  BMI 20.36 kg/m2  SpO2 100%  Home Medications   Current Outpatient Rx  Name  Route  Sig  Dispense  Refill  . ibuprofen (ADVIL,MOTRIN) 600 MG tablet   Oral   Take 1 tablet (600 mg total) by mouth every 6 (six) hours as needed for pain.   30 tablet   0   . naproxen (NAPROSYN) 500 MG tablet   Oral   Take 1 tablet (500 mg total) by mouth 2 (two) times daily with a meal.   30 tablet   0   . cyclobenzaprine (FLEXERIL) 10 MG tablet   Oral   Take 1 tablet (10 mg total) by mouth  2 (two) times daily as needed for muscle spasms.   20 tablet   0   . predniSONE (DELTASONE) 10 MG tablet      Take 5 tab day 1, take 4 tab day 2, take 3 tab day 3, take 2 tab day 4, and take 1 tab day 5   15 tablet   0   Physical Exam  Nursing note and vitals reviewed. Constitutional: He is oriented to person, place, and time. He appears well-developed and well-nourished. No distress.  HENT:  Head: Atraumatic.  Eyes: EOM are normal.  Neck: Neck supple.  Cardiovascular: Normal rate.   Pulmonary/Chest: Effort normal.  Musculoskeletal:       Right foot: He exhibits tenderness. He exhibits normal range of motion, no swelling, normal capillary refill, no deformity and no laceration.       Feet:  Pedal pulses equal bilateral, adequate circulation, tender on palpation of right heel. The pain  radiates to the toes. Achilles tendon intact, no defect palpated.  Neurological: He is alert and oriented to person, place, and time. No cranial nerve deficit.  Skin: Skin is warm and dry.  Psychiatric: He has a normal mood and affect. His behavior is normal.   Dg Foot Complete Right  02/15/2013   CLINICAL DATA:  Right heel pain for 1 week.  EXAM: RIGHT FOOT COMPLETE - 3+ VIEW  COMPARISON:  None.  FINDINGS: There is no evidence of fracture or dislocation. There is no evidence of arthropathy or other focal bone abnormality. Soft tissues are unremarkable.  IMPRESSION: Negative.   Electronically Signed   By: Drusilla Kanner M.D.   On: 02/15/2013 22:31    ED Course  Procedures  MDM  37 y.o. male with right foot pain x one week due to injury. Will treat with ace wrap and he will continue his ibuprofen. He will get a heel pad to go in his shoe. He will follow up with ortho if pain persist.  I have reviewed this patient's vital signs, nurses notes, appropriate imaging and discussed findings and plan of care with the patient. He voices understanding.    Medication List    STOP taking these medications       ibuprofen 200 MG tablet  Commonly known as:  ADVIL,MOTRIN      TAKE these medications       HYDROcodone-acetaminophen 5-325 MG per tablet  Commonly known as:  NORCO/VICODIN  Take 1 tablet by mouth every 4 (four) hours as needed.      ASK your doctor about these medications       albuterol 108 (90 BASE) MCG/ACT inhaler  Commonly known as:  PROVENTIL HFA;VENTOLIN HFA  Inhale 2 puffs into the lungs every 6 (six) hours as needed for wheezing or shortness of breath.     ALEVE 220 MG tablet  Generic drug:  naproxen sodium  Take 440 mg by mouth daily as needed (for pain).     B-COMPLEX PO  Take 1 tablet by mouth daily.            [place], [place] 02/15/13 2308

## 2013-02-15 NOTE — ED Provider Notes (Signed)
Medical screening examination/treatment/procedure(s) were performed by non-physician practitioner and as supervising physician I was immediately available for consultation/collaboration.    Vida Roller, MD 02/15/13 [phone]

## 2013-04-05 ENCOUNTER — Emergency Department (HOSPITAL_COMMUNITY)
Admission: EM | Admit: 2013-04-05 | Discharge: 2013-04-05 | Disposition: A | Discharge status: Home / Self Care | Payer: No Typology Code available for payment source | Attending: Emergency Medicine | Admitting: Emergency Medicine

## 2013-04-05 ENCOUNTER — Encounter (HOSPITAL_COMMUNITY): Payer: Self-pay | Admitting: Emergency Medicine

## 2013-04-05 ENCOUNTER — Emergency Department (HOSPITAL_COMMUNITY): Payer: No Typology Code available for payment source

## 2013-04-05 DIAGNOSIS — Z79899 Other long term (current) drug therapy: Secondary | ICD-10-CM | POA: Insufficient documentation

## 2013-04-05 DIAGNOSIS — S46909A Unspecified injury of unspecified muscle, fascia and tendon at shoulder and upper arm level, unspecified arm, initial encounter: Secondary | ICD-10-CM | POA: Insufficient documentation

## 2013-04-05 DIAGNOSIS — Y92410 Unspecified street and highway as the place of occurrence of the external cause: Secondary | ICD-10-CM | POA: Insufficient documentation

## 2013-04-05 DIAGNOSIS — S161XXA Strain of muscle, fascia and tendon at neck level, initial encounter: Secondary | ICD-10-CM

## 2013-04-05 DIAGNOSIS — S4980XA Other specified injuries of shoulder and upper arm, unspecified arm, initial encounter: Secondary | ICD-10-CM | POA: Insufficient documentation

## 2013-04-05 DIAGNOSIS — F17200 Nicotine dependence, unspecified, uncomplicated: Secondary | ICD-10-CM | POA: Insufficient documentation

## 2013-04-05 DIAGNOSIS — S139XXA Sprain of joints and ligaments of unspecified parts of neck, initial encounter: Secondary | ICD-10-CM | POA: Insufficient documentation

## 2013-04-05 DIAGNOSIS — Y9389 Activity, other specified: Secondary | ICD-10-CM | POA: Insufficient documentation

## 2013-04-05 MED ORDER — HYDROCODONE-ACETAMINOPHEN 5-325 MG PO TABS: 1.0000 | ORAL_TABLET | Freq: Four times a day (QID) | ORAL | Status: DC | PRN

## 2013-04-05 MED ORDER — HYDROCODONE-ACETAMINOPHEN 5-325 MG PO TABS
1.0000 | ORAL_TABLET | Freq: Once | ORAL | Status: AC
  Administered 2013-04-05: 1 via ORAL
  Filled 2013-04-05: qty 1
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
## 2013-04-05 NOTE — ED Notes (Signed)
MD at bedside. 
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
## 2014-04-26 ENCOUNTER — Emergency Department (HOSPITAL_COMMUNITY)
Admission: EM | Admit: 2014-04-26 | Discharge: 2014-04-26 | Disposition: A | Discharge status: Home / Self Care | Payer: PRIVATE HEALTH INSURANCE | Attending: Emergency Medicine | Admitting: Emergency Medicine

## 2014-04-26 ENCOUNTER — Emergency Department (HOSPITAL_COMMUNITY): Payer: PRIVATE HEALTH INSURANCE

## 2014-04-26 ENCOUNTER — Encounter (HOSPITAL_COMMUNITY): Payer: Self-pay | Admitting: Emergency Medicine

## 2014-04-26 DIAGNOSIS — R202 Paresthesia of skin: Secondary | ICD-10-CM | POA: Insufficient documentation

## 2014-04-26 DIAGNOSIS — Z720 Tobacco use: Secondary | ICD-10-CM | POA: Insufficient documentation

## 2014-04-26 DIAGNOSIS — Z9889 Other specified postprocedural states: Secondary | ICD-10-CM | POA: Diagnosis not present

## 2014-04-26 DIAGNOSIS — M5410 Radiculopathy, site unspecified: Secondary | ICD-10-CM

## 2014-04-26 DIAGNOSIS — G8929 Other chronic pain: Secondary | ICD-10-CM | POA: Diagnosis not present

## 2014-04-26 DIAGNOSIS — M5412 Radiculopathy, cervical region: Secondary | ICD-10-CM | POA: Insufficient documentation

## 2014-04-26 DIAGNOSIS — Z7952 Long term (current) use of systemic steroids: Secondary | ICD-10-CM | POA: Diagnosis not present

## 2014-04-26 DIAGNOSIS — R531 Weakness: Secondary | ICD-10-CM | POA: Diagnosis present

## 2014-04-26 DIAGNOSIS — Z791 Long term (current) use of non-steroidal anti-inflammatories (NSAID): Secondary | ICD-10-CM | POA: Diagnosis not present

## 2014-04-26 DIAGNOSIS — R29898 Other symptoms and signs involving the musculoskeletal system: Secondary | ICD-10-CM

## 2014-04-26 DIAGNOSIS — M5020 Other cervical disc displacement, unspecified cervical region: Secondary | ICD-10-CM | POA: Diagnosis not present

## 2014-04-26 DIAGNOSIS — Z79899 Other long term (current) drug therapy: Secondary | ICD-10-CM | POA: Diagnosis not present

## 2014-04-26 MED ORDER — OXYCODONE-ACETAMINOPHEN 5-325 MG PO TABS
2.0000 | ORAL_TABLET | Freq: Once | ORAL | Status: AC
  Administered 2014-04-26: 2 via ORAL

## 2014-04-26 MED ORDER — PREDNISONE 20 MG PO TABS: ORAL_TABLET | ORAL | Status: DC

## 2014-04-26 MED ORDER — LORAZEPAM 2 MG/ML IJ SOLN
1.0000 mg | Freq: Once | INTRAMUSCULAR | Status: AC
  Administered 2014-04-26: 1 mg via INTRAVENOUS

## 2014-04-26 MED ORDER — ORPHENADRINE CITRATE 30 MG/ML IJ SOLN
60.0000 mg | Freq: Two times a day (BID) | INTRAMUSCULAR | Status: DC
  Administered 2014-04-26: 60 mg via INTRAVENOUS
  Filled 2014-04-26: qty 2

## 2014-04-26 MED ORDER — KETOROLAC TROMETHAMINE 60 MG/2ML IM SOLN
60.0000 mg | Freq: Once | INTRAMUSCULAR | Status: DC
Start: 2014-04-26 — End: 2014-04-26

## 2014-04-26 MED ORDER — KETOROLAC TROMETHAMINE 30 MG/ML IJ SOLN
30.0000 mg | Freq: Once | INTRAMUSCULAR | Status: AC
  Administered 2014-04-26: 30 mg via INTRAVENOUS

## 2014-04-26 MED ORDER — ORPHENADRINE CITRATE 30 MG/ML IJ SOLN
60.0000 mg | Freq: Two times a day (BID) | INTRAMUSCULAR | Status: DC
  Filled 2014-04-26 (×2): qty 2

## 2014-04-26 MED ORDER — ORPHENADRINE CITRATE ER 100 MG PO TB12: 100.0000 mg | ORAL_TABLET | Freq: Two times a day (BID) | ORAL | Status: DC

## 2014-04-26 MED ORDER — OXYCODONE-ACETAMINOPHEN 5-325 MG PO TABS: 1.0000 | ORAL_TABLET | Freq: Four times a day (QID) | ORAL | Status: DC | PRN

## 2014-04-26 NOTE — ED Notes (Signed)
Pt arrived to the ED with a complaint of right arm pain and numbness.  Pt has been experiencing right arm pain and numbness for over 6 months but the pain and numbness over the last two weeks has increased.  Pt has a history of chronic neck and back injuries.

## 2014-04-26 NOTE — ED Notes (Signed)
Patient does not want a shot. He is requesting IV medicine please.

## 2014-04-26 NOTE — Discharge Instructions (Signed)
Herniated Disk °A herniated disk occurs when a disk in your spine bulges out too far. This condition is also called a ruptured disk or slipped disk. Your spine (backbone) is made up of bones called vertebrae. Between each pair of vertebrae is an oval disk with a soft, spongy center that acts as a shock absorber when you move. The spongy center is surrounded by a tough outer ring. °When you have a herniated disk, the spongy center of the disk bulges out or ruptures through the outer ring. A herniated disk can press on a nerve between your vertebrae and cause pain. A herniated disk can occur anywhere in your back or neck area, but the lower back is the most common spot. °CAUSES  °In many cases, a herniated disk occurs just from getting older. As you age, the spongy insides of your disks tend to shrink and dry out. A herniated disk can result from gradual wear and tear. Injury or sudden strain can also cause a herniated disk.  °RISK FACTORS °Aging is the main risk factor for a herniated disk. Other risk factors include: °· Being a man between the ages of 30 and 50 years. °· Having a job that requires heavy lifting, bending, or twisting. °· Having a job that requires long hours of driving. °· Not getting enough exercise. °· Being overweight. °· Smoking. °SIGNS AND SYMPTOMS  °Signs and symptoms depend on which disk is herniated. °· For a herniated disk in the lower back, you may have sharp pain in: °¨ One part of your leg, hip, or buttocks. °¨ The back of your calf. °¨ The top or sole of your foot (sciatica).   °· For a herniated disk in the neck, you may feel pain: °¨ When you move your neck. °¨ Near or over your shoulder blade. °¨ That moves to your upper arm, forearm, or fingers.   °· You may also have muscle weakness. It may be hard to: °¨ Lift your leg or arm. °¨ Stand on your toes. °¨ Squeeze tightly with one of your hands. °· Other symptoms can include: °¨ Numbness or tingling in the affected areas of your  body. °¨ Loss of bladder or bowel control. This is a rare but serious sign of a severe herniated disk in the lower back. °DIAGNOSIS  °Your health care provider will do a physical exam. During this exam, you may have to move certain body parts or assume various positions. For example, your health care provider may do the straight-leg test. This is a good way to test for a herniated disk in your lower back. In this test, the health care provider lifts your leg while you lie on your back. This is to see if you feel pain down your leg. Your health care provider will also check for numbness or loss of feeling. °· Your health care provider will also check your: °¨ Reflexes. °¨ Muscle strength. °¨ Posture. °· Other tests may be done to help in making a diagnosis. These may include: °¨ An X-ray of the spine to rule out other causes of back pain.   °¨ Other imaging studies, such as an MRI or CT scan. This is to check whether the herniated disk is pressing on your spinal canal. °¨ Electromyography (EMG). This test checks the nerves that control muscles. It is sometimes used to identify the specific area of nerve involvement.   °TREATMENT  °In many cases, herniated disk symptoms go away over a period of days or weeks. You will most   likely be free of symptoms in 3-4 months. Treatment may include the following: °· The initial treatment for a herniated disk is a short period of rest. °¨ Bed rest is often limited to 1 or 2 days. Resting for too long delays recovery. °¨ If you have a herniated disk in your lower back, you should avoid sitting as much as possible because sitting increases pressure on the disk. °· Medicines. These may include:   °¨ Nonsteroidal anti-inflammatory drugs (NSAIDs). °¨ Muscle relaxants for back spasms. °¨ Narcotic pain medicine if your pain is very bad.   °· Steroid injections. You may need these along the involved nerve root to help control pain. The steroid is injected in the area of the herniated disk.  It helps by reducing swelling around the disk. °· Physical therapy. This may include exercises to strengthen the muscles that help support your spine.   °· You may need surgery if other treatments do not work.   °HOME CARE INSTRUCTIONS °Follow all your health care provider's instructions. These may include: °· Take all medicines as directed by your health care provider. °· Rest for 2 days and then start moving. °¨ Do not sit or stand for long periods of time. °¨ Maintain good posture when sitting and standing. °¨ Avoid movements that cause pain, such as bending or lifting. °· When you are able to start lifting things again: °¨ Bend with your knees. °¨ Keep your back straight. °¨ Hold heavy objects close to your body. °· If you are overweight, ask your health care provider to help you start a weight-loss program. °· When you are able to start exercising, ask your health care provider how much and what type of exercise is best for you. °· Work with a physical therapist on stretching and strengthening exercises for your back. °· Do not wear high-heeled shoes. °· Do not sleep on your belly. °· Do not smoke. °· Keep all follow-up visits as directed by your health care provider. °SEEK MEDICAL CARE IF: °· You have back or neck pain that is not getting better after 4 weeks. °· You have very bad pain in your back or neck. °· You develop numbness, tingling, or weakness along with pain. °SEEK IMMEDIATE MEDICAL CARE IF:  °· You have numbness, tingling, or weakness that makes you unable to use your arms or legs. °· You lose control of your bladder or bowels. °· You have dizziness or fainting. °· You have shortness of breath.   °MAKE SURE YOU:  °· Understand these instructions. °· Will watch your condition. °· Will get help right away if you are not doing well or get worse. °Document Released: 05/07/2000 Document Revised: 09/24/2013 Document Reviewed: 04/13/2013 °ExitCare® Patient Information ©2015 [employer]. This information  is not intended to replace advice given to you by your health care provider. Make sure you discuss any questions you have with your health care provider. ° ° ° ° °Emergency Department Resource Guide °1) Find a Doctor and Pay Out of Pocket °Although you won't have to find out who is covered by your insurance plan, it is a good idea to ask around and get recommendations. You will then need to call the office and see if the doctor you have chosen will accept you as a new patient and what types of options they offer for patients who are self-pay. Some doctors offer discounts or will set up payment plans for their patients who do not have insurance, but you will need to ask so you aren't surprised   when you get to your appointment. ° °2) Contact Your Local Health Department °Not all health departments have doctors that can see patients for sick visits, but many do, so it is worth a call to see if yours does. If you don't know where your local health department is, you can check in your phone book. The CDC also has a tool to help you locate your state's health department, and many state websites also have listings of all of their local health departments. ° °3) Find a Walk-in Clinic °If your illness is not likely to be very severe or complicated, you may want to try a walk in clinic. These are popping up all over the country in pharmacies, drugstores, and shopping centers. They're usually staffed by nurse practitioners or physician assistants that have been trained to treat common illnesses and complaints. They're usually fairly quick and inexpensive. However, if you have serious medical issues or chronic medical problems, these are probably not your best option. ° °No Primary Care Doctor: °- Call [facility] at  832-8000 - they can help you locate a primary care doctor that  accepts your insurance, provides certain services, etc. °- Physician Referral Service- 1-800-533-3463 ° °Chronic Pain Problems: °Organization          Address  Phone   Notes  °[facility]  (336) 297-2271 Patients need to be referred by their primary care doctor.  ° °Medication Assistance: °Organization         Address  Phone   Notes  °[place] [facility] [address] °[place], [place] [address] (336) 641-8030 --Must be a resident of [place] °-- Must have NO insurance coverage whatsoever (no Medicaid/ Medicare, etc.) °-- The pt. MUST have a primary care doctor that directs their care regularly and follows them in the community °  °[facility]  (866) 331-1348   °[employer]  (888) 892-1162   ° °Agencies that provide inexpensive medical care: °Organization         Address  Phone   Notes  °[facility]  (336) 832-8035   °[facility]    (336) 832-7272   °[facility] [address] °[place], [place] [address] (336) 832-4777   °[facility] [address], °[place] (336) 271-4999   °[facility]    (336) 373-0678   °[facility]    (336) 272-1050   °[facility] ° [address], [place] Phone:  (336) 832-4444, Fax:  (336) 832-4440 Hours of Operation:  9 am - 6 pm, M-F.  Also accepts Medicaid/Medicare and self-pay.  °[facility] ° [address], [place] Phone: (336) 832-3150, Fax: (336) [phone]. Hours of Operation:  8:30 am - 5:30 pm, M-F.  Also accepts Medicaid and self-pay.  °[facility] [place] [address], [place] Phone: (336) 878-6027   °[facility] [address], [place], [place] (336)723-1848, Ext. 123 Mondays & Thursdays: 7-9 AM.  First 15 patients are seen on a first come, first serve basis. °  ° °Medicaid-accepting [place] Providers: ° °Organization         Address  Phone   Notes  °[facility] [address], [place] (336) 641-2100 Also accepts self-pay patients.   °[facility] [address], [place] ° (336) 856-9996   °[facility] [address], [place] (336) 288-8857   °[facility] [address] [place] [address], [place] (336) 299-7000   °Veita Bland [address], [place]  ° (336) 373-1557 Only accepts [place] Access Medicaid patients after they have their name applied to their card.  ° °Self-Pay (no insurance) in [place]: ° °Organization         Address  Phone   Notes  °Sickle Cell Patients, [facility] [address], [place] (336) 832-1970   °[facility] [address], [place] (336) 832-4400   °[facility] ° [address], [place] (336) 992-4800   °[facility]/Dr. Osei-Bonsu ° [address] [place] [address], [place] or [address], [place] (336) [phone] Phone number for both [place] and [place] locations is the same.  °[facility] [address], [place] (336) 299-0000   °[facility] [address] [place] [address], [place] or [address] (336) 852-7530 °(336) 878-2260   °[facility] [address], [place] (336) 350-1642, phone; (336) 294-5005, fax Sees patients 1st and 3rd Saturday of every month.  Must not qualify for public or private insurance (i.e. Medicaid, Medicare, [place] Health Choice, Veterans' Benefits) • Household income should be no more than 200% of the poverty level •The clinic cannot treat you if you are pregnant or think you are pregnant • Sexually transmitted diseases are not treated at the clinic.  ° ° °Dental Care: °Organization         Address  Phone  Notes  °[facility] [address], [place] (336) 641-6152 Accepts children up to age 21 who are enrolled in Medicaid or [place] Health Choice; pregnant women with a Medicaid  card; and children who have applied for Medicaid or [place] Health Choice, but were declined, whose parents can pay a reduced fee at time of service.  °[facility] [place]  [address], [place] (336) 641-7733 Accepts children up to age 21 who are enrolled in Medicaid or [place] Health Choice; pregnant women with a Medicaid card; and children who have applied for Medicaid or [place] Health Choice, but were declined, whose parents can pay a reduced fee at time of service.  °[facility] ° [address], [place] (336) 641-4533 Patients are seen by appointment only. Walk-ins are not accepted. [facility] will see patients 18 years of age and older. °Monday - Tuesday (8am-5pm) °Most Wednesdays (8:30-5pm) °$30 per visit, cash only  °[facility] ° [address], [place] (336) 641-4533 Patients are seen by appointment only. Walk-ins are not accepted. [facility] will see patients 18 years of age and older. °One Wednesday Evening (Monthly: Volunteer Based).  $30 per visit, cash only  °[facility]  (919) 537-3737 for adults; Children under age 4, call Graduate Pediatric Dentistry at ([phone]) [phone]. Children aged 4-14, please call ([phone]) [phone] to request a pediatric application. ° Dental services are provided in all areas of dental care including fillings, crowns and bridges, complete and partial dentures, implants, gum treatment, root canals, and extractions. Preventive care is also provided. Treatment is provided to both adults and children. °Patients are selected via a lottery and there is often a waiting list. °  °[facility] [address], °[place] ° (336) 763-8833 [email or web address] °  °[facility] [address], [place], [place] (336)723-1848, Ext. 123 Second and Fourth Thursday of each month, opens at 6:30 AM; Clinic ends at 9 AM.  Patients are seen on a first-come  first-served basis, and a limited number are seen during each clinic.  ° °[facility] ° [address], [place], [place] (336) 723-7904   Eligibility Requirements °You must have lived in [place], [place], or [place] counties for at least the last three months. °  You cannot be eligible for state or federal sponsored healthcare insurance, including Veterans Administration, Medicaid, or Medicare. °  You generally cannot be eligible for healthcare insurance through your employer.  °  How to apply: °Eligibility screenings are held every Tuesday and Wednesday afternoon from 1:00 pm until 4:00 pm. You do not need an appointment for the interview!  °[facility] [address], [place], [place] 336-631-2330   °[facility]  336-342-8273   °[facility]  336-703-3100   °[facility]  336-570-6415   ° °Behavioral Health Resources in the Community: °Intensive Outpatient Programs °Organization         Address  Phone  Notes  °[facility] [address], [place], [place] 336-878-6098   °[facility] [address], [place], [place] 336-832-9800   °[facility] [address], [place], [place] ° 336-882-2125   °[facility] [address],  °[place], [place] 1-800-853-5163 or 336-641-4981   °Substance Abuse Resources °Organization         Address  Phone  Notes  °[facility]  336-882-2125   °[facility]  336-784-9470   °[facility]  336-285-9073   °[facility]  336-845-3988   °Residential & Outpatient Substance Abuse Program  1-800-659-3381   °Psychological Services °Organization         Address  Phone  Notes  ° Health  336- 832-9600   °[facility]  336- 378-7881   °[facility] [address], [place] 1-800-853-5163 or 336-641-4981   ° °Mobile Crisis Teams °Organization          Address  Phone  Notes  °[facility], Mobile Crisis Care Unit  1-877-626-1772   °Assertive °[facility] ° [address] [place], [place] 336-834-9664   °Sharon DeEsch [address] °[place] 336-554-5454   ° °Self-Help/Support Groups °Organization         Address  Phone             Notes  °[facility] - variety of support groups  336- 373-1402 Call for more information  °Narcotics Anonymous (NA), [facility] [address], °[place]  2 meetings at this location  ° °Residential Treatment Programs °Organization         Address  Phone  Notes  °[facility] [address],    °[place]  1-866-801-8205   °[facility] ° [address], [place], [place] 704-293-8524   °[facility] [address], [place] 336-845-3988 Admissions: 8am-3pm M-F  °[facility] [address]    °[place], [place] 336-841-1104   °[facility] [address], , [place] 336-379-7146   °[facility] [address]  °  [place], [place] 336-285-9073   °[facility] Intensive Outpatient [address], [place], [place] 336-852-3033   °[facility] [address]  °[place], [place] 1-877-615-2722 or 336-784-9470   °[facility] [address] [place], [place] 336-227-7417 Accepts Medicaid  °[facility] [address]  °[place] 1-800-659-3381 Substance Abuse/Addiction Treatment  ° °[place] Behavioral Health Resources °Organization         Address  Phone  Notes  °[facility]  (888) 581-9988   °Julie Brannon, PhD [address] [place], [place]   (336) 349-5553 or (336) 951-0000   °[facility]   [address] °[place], [place] (336) 349-4454   °[facility] [address], [place], [place] (336) 342-8316 Insurance/Medicaid/sponsorship  through [facility]  °[facility] [address]                                    [place], [place] (336) 342-8316 Therapy/tele-psych/case  °[facility] [address]  ° [place], [place] (336) 349-2233    °Dr. Arfeen  (336) 349-4544   °[facility] [place]  [employer] [place] [facility] 1) [address],  °2) [address], [place] °3)  [address], [place] (336) 349-3220 °(336) 342-7768 ° °(336) 342-8140   °[place] [facility] (336) 342-1394 or (336) 342-3537 (After Hours)    ° ° ° °

## 2014-04-26 NOTE — ED Notes (Signed)
Patient is in MRI.  
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
## 2014-05-09 ENCOUNTER — Telehealth: Payer: Self-pay | Admitting: Orthopedic Surgery
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
## 2014-05-15 ENCOUNTER — Other Ambulatory Visit: Payer: Self-pay | Admitting: Orthopaedic Surgery

## 2014-05-15 DIAGNOSIS — M25511 Pain in right shoulder: Secondary | ICD-10-CM

## 2014-07-27 ENCOUNTER — Emergency Department (HOSPITAL_COMMUNITY)
Admission: EM | Admit: 2014-07-27 | Discharge: 2014-07-28 | Disposition: A | Discharge status: Home / Self Care | Payer: PRIVATE HEALTH INSURANCE | Attending: Emergency Medicine | Admitting: Emergency Medicine

## 2014-07-27 ENCOUNTER — Emergency Department (HOSPITAL_COMMUNITY): Payer: PRIVATE HEALTH INSURANCE

## 2014-07-27 ENCOUNTER — Encounter (HOSPITAL_COMMUNITY): Payer: Self-pay

## 2014-07-27 DIAGNOSIS — Y998 Other external cause status: Secondary | ICD-10-CM | POA: Insufficient documentation

## 2014-07-27 DIAGNOSIS — S00412A Abrasion of left ear, initial encounter: Secondary | ICD-10-CM | POA: Diagnosis not present

## 2014-07-27 DIAGNOSIS — S161XXA Strain of muscle, fascia and tendon at neck level, initial encounter: Secondary | ICD-10-CM | POA: Diagnosis not present

## 2014-07-27 DIAGNOSIS — Z720 Tobacco use: Secondary | ICD-10-CM | POA: Insufficient documentation

## 2014-07-27 DIAGNOSIS — S0001XA Abrasion of scalp, initial encounter: Secondary | ICD-10-CM | POA: Diagnosis not present

## 2014-07-27 DIAGNOSIS — Y9389 Activity, other specified: Secondary | ICD-10-CM | POA: Diagnosis not present

## 2014-07-27 DIAGNOSIS — G8929 Other chronic pain: Secondary | ICD-10-CM | POA: Insufficient documentation

## 2014-07-27 DIAGNOSIS — Z79899 Other long term (current) drug therapy: Secondary | ICD-10-CM | POA: Insufficient documentation

## 2014-07-27 DIAGNOSIS — Y9289 Other specified places as the place of occurrence of the external cause: Secondary | ICD-10-CM | POA: Insufficient documentation

## 2014-07-27 DIAGNOSIS — T148XXA Other injury of unspecified body region, initial encounter: Secondary | ICD-10-CM

## 2014-07-27 MED ORDER — OXYCODONE-ACETAMINOPHEN 5-325 MG PO TABS
1.0000 | ORAL_TABLET | Freq: Once | ORAL | Status: AC
  Administered 2014-07-27: 1 via ORAL
  Filled 2014-07-27: qty 1

## 2014-07-27 NOTE — ED Notes (Signed)
Patient denies any LOC.
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
## 2014-07-28 MED ORDER — IBUPROFEN 600 MG PO TABS: 600.0000 mg | ORAL_TABLET | Freq: Four times a day (QID) | ORAL | Status: DC | PRN

## 2014-07-28 MED ORDER — OXYCODONE-ACETAMINOPHEN 5-325 MG PO TABS
1.0000 | ORAL_TABLET | Freq: Once | ORAL | Status: AC
  Administered 2014-07-28: 1 via ORAL
  Filled 2014-07-28: qty 1

## 2014-07-28 MED ORDER — KETOROLAC TROMETHAMINE 60 MG/2ML IM SOLN
60.0000 mg | Freq: Once | INTRAMUSCULAR | Status: AC
  Administered 2014-07-28: 60 mg via INTRAMUSCULAR
  Filled 2014-07-28: qty 2

## 2014-07-28 MED ORDER — OXYCODONE-ACETAMINOPHEN 5-325 MG PO TABS: 1.0000 | ORAL_TABLET | Freq: Four times a day (QID) | ORAL | Status: DC | PRN

## 2014-07-28 NOTE — ED Notes (Signed)
Pt requesting something else for pain, Dr. Wilkie AyeHorton informed and orders given and carried out

## 2014-07-28 NOTE — Discharge Instructions (Signed)
Assault, General Assault includes any behavior, whether intentional or reckless, which results in bodily injury to another person and/or damage to property. Included in this would be any behavior, intentional or reckless, that by its nature would be understood (interpreted) by a reasonable person as intent to harm another person or to damage his/her property. Threats may be oral or written. They may be communicated through regular mail, computer, fax, or phone. These threats may be direct or implied. FORMS OF ASSAULT INCLUDE:  Physically assaulting a person. This includes physical threats to inflict physical harm as well as:  Slapping.  Hitting.  Poking.  Kicking.  Punching.  Pushing.  Arson.  Sabotage.  Equipment vandalism.  Damaging or destroying property.  Throwing or hitting objects.  Displaying a weapon or an object that appears to be a weapon in a threatening manner.  Carrying a firearm of any kind.  Using a weapon to harm someone.  Using greater physical size/strength to intimidate another.  Making intimidating or threatening gestures.  Bullying.  Hazing.  Intimidating, threatening, hostile, or abusive language directed toward another person.  It communicates the intention to engage in violence against that person. And it leads a reasonable person to expect that violent behavior may occur.  Stalking another person. IF IT HAPPENS AGAIN:  Immediately call for emergency help (911 in [place]  If someone poses clear and immediate danger to you, seek legal authorities to have a protective or restraining order put in place.  Less threatening assaults can at least be reported to authorities. STEPS TO TAKE IF A SEXUAL ASSAULT HAS HAPPENED  Go to an area of safety. This may include a shelter or staying with a friend. Stay away from the area where you have been attacked. A large percentage of sexual assaults are caused by a friend, relative or associate.  If  medications were given by your caregiver, take them as directed for the full length of time prescribed.  Only take over-the-counter or prescription medicines for pain, discomfort, or fever as directed by your caregiver.  If you have come in contact with a sexual disease, find out if you are to be tested again. If your caregiver is concerned about the HIV/AIDS virus, he/she may require you to have continued testing for several months.  For the protection of your privacy, test results can not be given over the phone. Make sure you receive the results of your test. If your test results are not back during your visit, make an appointment with your caregiver to find out the results. Do not assume everything is normal if you have not heard from your caregiver or the medical facility. It is important for you to follow up on all of your test results.  File appropriate papers with authorities. This is important in all assaults, even if it has occurred in a family or by a friend. SEEK MEDICAL CARE IF:  You have new problems because of your injuries.  You have problems that may be because of the medicine you are taking, such as:  Rash.  Itching.  Swelling.  Trouble breathing.  You develop belly (abdominal) pain, feel sick to your stomach (nausea) or are vomiting.  You begin to run a temperature.  You need supportive care or referral to a rape crisis center. These are centers with trained personnel who can help you get through this ordeal. SEEK IMMEDIATE MEDICAL CARE IF:  You are afraid of being threatened, beaten, or abused. In [place] call 911.  You  receive new injuries related to abuse.  You develop severe pain in any area injured in the assault or have any change in your condition that concerns you.  You faint or lose consciousness.  You develop chest pain or shortness of breath. Document Released: 05/10/2005 Document Revised: 08/02/2011 Document Reviewed: 12/27/2007 [facility] Patient  Information 2015 [place], [place]. This information is not intended to replace advice given to you by your health care provider. Make sure you discuss any questions you have with your health care provider.  Cervical Sprain A cervical sprain is an injury in the neck in which the strong, fibrous tissues (ligaments) that connect your neck bones stretch or tear. Cervical sprains can range from mild to severe. Severe cervical sprains can cause the neck vertebrae to be unstable. This can lead to damage of the spinal cord and can result in serious nervous system problems. The amount of time it takes for a cervical sprain to get better depends on the cause and extent of the injury. Most cervical sprains heal in 1 to 3 weeks. CAUSES  Severe cervical sprains may be caused by:   Contact sport injuries (such as from football, rugby, wrestling, hockey, auto racing, gymnastics, diving, martial arts, or boxing).   Motor vehicle collisions.   Whiplash injuries. This is an injury from a sudden forward and backward whipping movement of the head and neck.  Falls.  Mild cervical sprains may be caused by:   Being in an awkward position, such as while cradling a telephone between your ear and shoulder.   Sitting in a chair that does not offer proper support.   Working at a poorly Landscape [employer] station.   Looking up or down for long periods of time.  SYMPTOMS   Pain, soreness, stiffness, or a burning sensation in the front, back, or sides of the neck. This discomfort may develop immediately after the injury or slowly, 24 hours or more after the injury.   Pain or tenderness directly in the middle of the back of the neck.   Shoulder or upper back pain.   Limited ability to move the neck.   Headache.   Dizziness.   Weakness, numbness, or tingling in the hands or arms.   Muscle spasms.   Difficulty swallowing or chewing.   Tenderness and swelling of the neck.  DIAGNOSIS  Most of  the time your health care provider can diagnose a cervical sprain by taking your history and doing a physical exam. Your health care provider will ask about previous neck injuries and any known neck problems, such as arthritis in the neck. X-rays may be taken to find out if there are any other problems, such as with the bones of the neck. Other tests, such as a CT scan or MRI, may also be needed.  TREATMENT  Treatment depends on the severity of the cervical sprain. Mild sprains can be treated with rest, keeping the neck in place (immobilization), and pain medicines. Severe cervical sprains are immediately immobilized. Further treatment is done to help with pain, muscle spasms, and other symptoms and may include:  Medicines, such as pain relievers, numbing medicines, or muscle relaxants.   Physical therapy. This may involve stretching exercises, strengthening exercises, and posture training. Exercises and improved posture can help stabilize the neck, strengthen muscles, and help stop symptoms from returning.  HOME CARE INSTRUCTIONS   Put ice on the injured area.   Put ice in a plastic bag.   Place a towel between your skin  and the bag.   Leave the ice on for 15-20 minutes, 3-4 times a day.   If your injury was severe, you may have been given a cervical collar to wear. A cervical collar is a two-piece collar designed to keep your neck from moving while it heals.  Do not remove the collar unless instructed by your health care provider.  If you have long hair, keep it outside of the collar.  Ask your health care provider before making any adjustments to your collar. Minor adjustments may be required over time to improve comfort and reduce pressure on your chin or on the back of your head.  Ifyou are allowed to remove the collar for cleaning or bathing, follow your health care provider's instructions on how to do so safely.  Keep your collar clean by wiping it with mild soap and water  and drying it completely. If the collar you have been given includes removable pads, remove them every 1-2 days and hand wash them with soap and water. Allow them to air dry. They should be completely dry before you wear them in the collar.  If you are allowed to remove the collar for cleaning and bathing, wash and dry the skin of your neck. Check your skin for irritation or sores. If you see any, tell your health care provider.  Do not drive while wearing the collar.   Only take over-the-counter or prescription medicines for pain, discomfort, or fever as directed by your health care provider.   Keep all follow-up appointments as directed by your health care provider.   Keep all physical therapy appointments as directed by your health care provider.   Make any needed adjustments to your workstation to promote good posture.   Avoid positions and activities that make your symptoms worse.   Warm up and stretch before being active to help prevent problems.  SEEK MEDICAL CARE IF:   Your pain is not controlled with medicine.   You are unable to decrease your pain medicine over time as planned.   Your activity level is not improving as expected.  SEEK IMMEDIATE MEDICAL CARE IF:   You develop any bleeding.  You develop stomach upset.  You have signs of an allergic reaction to your medicine.   Your symptoms get worse.   You develop new, unexplained symptoms.   You have numbness, tingling, weakness, or paralysis in any part of your body.  MAKE SURE YOU:   Understand these instructions.  Will watch your condition.  Will get help right away if you are not doing well or get worse. Document Released: 03/07/2007 Document Revised: 05/15/2013 Document Reviewed: 11/15/2012 [facility] Patient Information 2015 [place]. This information is not intended to replace advice given to you by your health care provider. Make sure you discuss any questions you have with your health  care provider.

## 2015-02-21 ENCOUNTER — Encounter (HOSPITAL_COMMUNITY): Payer: Self-pay

## 2015-02-21 ENCOUNTER — Emergency Department (HOSPITAL_COMMUNITY): Payer: PRIVATE HEALTH INSURANCE

## 2015-02-21 ENCOUNTER — Emergency Department (HOSPITAL_COMMUNITY)
Admission: EM | Admit: 2015-02-21 | Discharge: 2015-02-21 | Disposition: A | Discharge status: Home / Self Care | Payer: PRIVATE HEALTH INSURANCE | Attending: Emergency Medicine | Admitting: Emergency Medicine

## 2015-02-21 DIAGNOSIS — Z9889 Other specified postprocedural states: Secondary | ICD-10-CM | POA: Insufficient documentation

## 2015-02-21 DIAGNOSIS — M25511 Pain in right shoulder: Secondary | ICD-10-CM | POA: Insufficient documentation

## 2015-02-21 DIAGNOSIS — Z791 Long term (current) use of non-steroidal anti-inflammatories (NSAID): Secondary | ICD-10-CM | POA: Insufficient documentation

## 2015-02-21 DIAGNOSIS — Z720 Tobacco use: Secondary | ICD-10-CM | POA: Insufficient documentation

## 2015-02-21 DIAGNOSIS — G8929 Other chronic pain: Secondary | ICD-10-CM | POA: Insufficient documentation

## 2015-02-21 DIAGNOSIS — M25551 Pain in right hip: Secondary | ICD-10-CM | POA: Insufficient documentation

## 2015-02-21 MED ORDER — TRAMADOL HCL 50 MG PO TABS: 50.0000 mg | ORAL_TABLET | Freq: Four times a day (QID) | ORAL | Status: DC | PRN

## 2015-02-21 MED ORDER — NAPROXEN 500 MG PO TABS: 500.0000 mg | ORAL_TABLET | Freq: Two times a day (BID) | ORAL | Status: DC

## 2015-02-21 MED ORDER — HYDROCODONE-ACETAMINOPHEN 5-325 MG PO TABS
1.0000 | ORAL_TABLET | Freq: Once | ORAL | Status: AC
  Administered 2015-02-21: 1 via ORAL
  Filled 2015-02-21: qty 1

## 2015-02-21 NOTE — ED Notes (Signed)
   02/21/15 1308  Musculoskeletal  Musculoskeletal (WDL) X  RUE Limited movement  RLE Limited movement  pt reports right shoulder pain that started 3 days ago. Pt has lower back pain & now its going down his right leg. Pt denies any new activity & injury.

## 2015-02-21 NOTE — Discharge Instructions (Signed)
Follow up with dr. Romeo Apple in 1-2 weeks.  Get a family md

## 2015-02-21 NOTE — ED Notes (Signed)
Pt reports chronic back problems and states now his right shoulder and right hip have started to hurt and denies injury

## 2015-02-24 NOTE — ED Provider Notes (Signed)
CSN: [id number]     Arrival date & time 02/21/15  0619 History   First MD Initiated Contact with Patient 02/21/15 [phone]     Chief Complaint  Patient presents with  . Shoulder Pain  . Hip Pain     (Consider location/radiation/quality/duration/timing/severity/associated sxs/prior Treatment) Patient is a 39 y.o. male presenting with shoulder pain and hip pain. The history is provided by the patient (Patient complains of right shoulder pain also some pain up the back. He has pain with movement of shoulder).  Shoulder Pain Upper extremity pain location: pain in right shoulder. Pain details:    Quality:  Dull   Radiates to:  Does not radiate   Severity:  Moderate   Onset quality:  Sudden   Timing:  Constant Chronicity:  New Associated symptoms: no back pain and no fatigue   Hip Pain Pertinent negatives include no chest pain, no abdominal pain and no headaches.    Past Medical History  Diagnosis Date  . Chronic back pain    Past Surgical History  Procedure Laterality Date  . Arm surgery  left      tendon and laceration repair  . Shoulder surgery     No family history on file. Social History  Substance Use Topics  . Smoking status: Current Every Day Smoker -- 1.50 packs/day    Types: Cigarettes  . Smokeless tobacco: None  . Alcohol Use: No    Review of Systems  Constitutional: Negative for appetite change and fatigue.  HENT: Negative for congestion, ear discharge and sinus pressure.   Eyes: Negative for discharge.  Respiratory: Negative for cough.   Cardiovascular: Negative for chest pain.  Gastrointestinal: Negative for abdominal pain and diarrhea.  Genitourinary: Negative for frequency and hematuria.  Musculoskeletal: Negative for back pain.       Pain in right shoulder  Skin: Negative for rash.  Neurological: Negative for seizures and headaches.  Psychiatric/Behavioral: Negative for hallucinations.      Allergies  Review of patient's allergies indicates no  known allergies.  Home Medications   Prior to Admission medications   Medication Sig Start Date End Date Taking? Authorizing Provider  ibuprofen (ADVIL,MOTRIN) 200 MG tablet Take 800 mg by mouth every 6 (six) hours as needed.   Yes Historical Provider, MD  ibuprofen (ADVIL,MOTRIN) 600 MG tablet Take 1 tablet (600 mg total) by mouth every 6 (six) hours as needed. 07/28/14  Yes Shon Baton, MD  albuterol (PROVENTIL HFA;VENTOLIN HFA) 108 (90 BASE) MCG/ACT inhaler Inhale 2 puffs into the lungs every 6 (six) hours as needed for wheezing or shortness of breath.    Historical Provider, MD  Aspirin-Salicylamide-Caffeine (BC HEADACHE POWDER PO) Take 1 Package by mouth 4 (four) times daily as needed (pain).     Historical Provider, MD  HYDROcodone-acetaminophen (NORCO/VICODIN) 5-325 MG per tablet Take 1 tablet by mouth every 6 (six) hours as needed for moderate pain. Patient not taking: Reported on 04/26/2014 04/05/13   Bethann Berkshire, MD  naproxen (NAPROSYN) 500 MG tablet Take 1 tablet (500 mg total) by mouth 2 (two) times daily. 02/21/15   Bethann Berkshire, MD  orphenadrine (NORFLEX) 100 MG tablet Take 1 tablet (100 mg total) by mouth 2 (two) times daily. Patient not taking: Reported on 07/27/2014 04/26/14   Arby Barrette, MD  oxyCODONE-acetaminophen (PERCOCET/ROXICET) 5-325 MG per tablet Take 1 tablet by mouth every 6 (six) hours as needed for severe pain. 07/28/14   Shon Baton, MD  predniSONE (DELTASONE) 20 MG tablet 3  tabs po daily x 3 days, then 2 tabs x 3 days, then 1.5 tabs x 3 days, then 1 tab x 3 days, then 0.5 tabs x 3 days Patient not taking: Reported on 07/27/2014 04/26/14   Arby Barrette, MD  traMADol (ULTRAM) 50 MG tablet Take 1 tablet (50 mg total) by mouth every 6 (six) hours as needed. 02/21/15   Bethann Berkshire, MD   BP 108/71 mmHg  Pulse 75  Temp(Src) 98.5 F (36.9 C) (Oral)  Resp 16  Ht  (1.651 m)  Wt 130 lb (58.968 kg)  BMI 21.63 kg/m2  SpO2 98% Physical Exam   Constitutional: He is oriented to person, place, and time. He appears well-developed.  HENT:  Head: Normocephalic.  Eyes: Conjunctivae and EOM are normal. No scleral icterus.  Neck: Neck supple. No thyromegaly present.  Cardiovascular: Normal rate and regular rhythm.  Exam reveals no gallop and no friction rub.   No murmur heard. Pulmonary/Chest: No stridor. He has no wheezes. He has no rales. He exhibits no tenderness.  Abdominal: He exhibits no distension. There is no tenderness. There is no rebound.  Musculoskeletal: He exhibits no edema.  Tender right shoulder.  Pain with movement  Lymphadenopathy:    He has no cervical adenopathy.  Neurological: He is oriented to person, place, and time. He exhibits normal muscle tone. Coordination normal.  Skin: No rash noted. No erythema.  Psychiatric: He has a normal mood and affect. His behavior is normal.    ED Course  Procedures (including critical care time) Labs Review Labs Reviewed - No data to display  Imaging Review No results found. I have personally reviewed and evaluated these images and lab results as part of my medical decision-making.   EKG Interpretation None      MDM   Final diagnoses:  Shoulder pain, right   X-rays unremarkable patient has tenderness in right shoulder. Will treat for inflamed rotator cuff with nonsteroidals and pain medicine. Patient is to follow-up with orthopedic    Bethann Berkshire, MD 02/24/15 1245

## 2019-04-06 ENCOUNTER — Other Ambulatory Visit: Payer: Self-pay

## 2019-04-06 DIAGNOSIS — F17210 Nicotine dependence, cigarettes, uncomplicated: Secondary | ICD-10-CM | POA: Diagnosis not present

## 2019-04-06 DIAGNOSIS — Z20828 Contact with and (suspected) exposure to other viral communicable diseases: Secondary | ICD-10-CM | POA: Diagnosis not present

## 2019-04-06 DIAGNOSIS — F15259 Other stimulant dependence with stimulant-induced psychotic disorder, unspecified: Secondary | ICD-10-CM | POA: Diagnosis not present

## 2019-04-07 ENCOUNTER — Encounter (HOSPITAL_COMMUNITY): Payer: Self-pay

## 2019-04-07 ENCOUNTER — Other Ambulatory Visit: Payer: Self-pay

## 2019-04-07 ENCOUNTER — Emergency Department (HOSPITAL_COMMUNITY): Payer: Self-pay

## 2019-04-07 ENCOUNTER — Emergency Department (HOSPITAL_COMMUNITY)
Admission: EM | Admit: 2019-04-07 | Discharge: 2019-04-10 | Disposition: A | Discharge status: Other Acute Inpatient Hospital | Payer: Self-pay | Attending: Emergency Medicine | Admitting: Emergency Medicine

## 2019-04-07 DIAGNOSIS — F29 Unspecified psychosis not due to a substance or known physiological condition: Secondary | ICD-10-CM

## 2019-04-07 LAB — CBC WITH DIFFERENTIAL/PLATELET
Abs Immature Granulocytes: 0.04 10*3/uL (ref 0.00–0.07)
Basophils Absolute: 0.1 10*3/uL (ref 0.0–0.1)
Basophils Relative: 1 %
Eosinophils Absolute: 0.2 10*3/uL (ref 0.0–0.5)
Eosinophils Relative: 1 %
HCT: 36.8 % — ABNORMAL LOW (ref 39.0–52.0)
Hemoglobin: 12.1 g/dL — ABNORMAL LOW (ref 13.0–17.0)
Immature Granulocytes: 0 %
Lymphocytes Relative: 22 %
Lymphs Abs: 2.9 10*3/uL (ref 0.7–4.0)
MCH: 30.8 pg (ref 26.0–34.0)
MCHC: 32.9 g/dL (ref 30.0–36.0)
MCV: 93.6 fL (ref 80.0–100.0)
Monocytes Absolute: 1 10*3/uL (ref 0.1–1.0)
Monocytes Relative: 7 %
Neutro Abs: 9 10*3/uL — ABNORMAL HIGH (ref 1.7–7.7)
Neutrophils Relative %: 69 %
Platelets: 424 10*3/uL — ABNORMAL HIGH (ref 150–400)
RBC: 3.93 MIL/uL — ABNORMAL LOW (ref 4.22–5.81)
RDW: 13.2 % (ref 11.5–15.5)
WBC: 13.2 10*3/uL — ABNORMAL HIGH (ref 4.0–10.5)
nRBC: 0 % (ref 0.0–0.2)

## 2019-04-07 LAB — COMPREHENSIVE METABOLIC PANEL
ALT: 11 U/L (ref 0–44)
AST: 16 U/L (ref 15–41)
Albumin: 4.2 g/dL (ref 3.5–5.0)
Alkaline Phosphatase: 73 U/L (ref 38–126)
Anion gap: 9 (ref 5–15)
BUN: 6 mg/dL (ref 6–20)
CO2: 24 mmol/L (ref 22–32)
Calcium: 8.9 mg/dL (ref 8.9–10.3)
Chloride: 101 mmol/L (ref 98–111)
Creatinine, Ser: 1.03 mg/dL (ref 0.61–1.24)
GFR calc Af Amer: >60 mL/min (ref >60)
GFR calc non Af Amer: >60 mL/min (ref >60)
Glucose, Bld: 110 mg/dL — ABNORMAL HIGH (ref 70–99)
Potassium: 3.3 mmol/L — ABNORMAL LOW (ref 3.5–5.1)
Sodium: 134 mmol/L — ABNORMAL LOW (ref 135–145)
Total Bilirubin: 0.7 mg/dL (ref 0.3–1.2)
Total Protein: 7.6 g/dL (ref 6.5–8.1)

## 2019-04-07 LAB — ACETAMINOPHEN LEVEL: Acetaminophen (Tylenol), Serum: <10 ug/mL — ABNORMAL LOW (ref 10–30)

## 2019-04-07 LAB — RAPID URINE DRUG SCREEN, HOSP PERFORMED
Amphetamines: POSITIVE — AB
Barbiturates: NOT DETECTED
Benzodiazepines: NOT DETECTED
Cocaine: NOT DETECTED
Opiates: NOT DETECTED
Tetrahydrocannabinol: NOT DETECTED

## 2019-04-07 LAB — ETHANOL: Alcohol, Ethyl (B): <10 mg/dL (ref <10)

## 2019-04-07 LAB — SALICYLATE LEVEL: Salicylate Lvl: <7 mg/dL (ref 2.8–30.0)

## 2019-04-07 MED ORDER — OLANZAPINE 5 MG PO TABS
10.0000 mg | ORAL_TABLET | Freq: Every day | ORAL | Status: DC
  Administered 2019-04-07 – 2019-04-09 (×3): 10 mg via ORAL
  Filled 2019-04-07 (×3): qty 2

## 2019-04-07 MED ORDER — ALBUTEROL SULFATE HFA 108 (90 BASE) MCG/ACT IN AERS
2.0000 | INHALATION_SPRAY | Freq: Four times a day (QID) | RESPIRATORY_TRACT | Status: DC | PRN
  Administered 2019-04-07 – 2019-04-09 (×2): 2 via RESPIRATORY_TRACT
  Filled 2019-04-07: qty 6.7

## 2019-04-07 MED ORDER — IBUPROFEN 400 MG PO TABS: 600.0000 mg | ORAL_TABLET | Freq: Four times a day (QID) | ORAL | Status: DC | PRN

## 2019-04-07 MED ORDER — OLANZAPINE 5 MG PO TBDP
10.0000 mg | ORAL_TABLET | Freq: Once | ORAL | Status: AC
  Administered 2019-04-07: 10 mg via ORAL
  Filled 2019-04-07: qty 2

## 2019-04-07 MED ORDER — OLANZAPINE 5 MG PO TBDP
10.0000 mg | ORAL_TABLET | Freq: Every day | ORAL | Status: DC | PRN
  Administered 2019-04-08: 10 mg via ORAL
  Filled 2019-04-07: qty 2

## 2019-04-07 MED ORDER — LORAZEPAM 1 MG PO TABS
2.0000 mg | ORAL_TABLET | Freq: Once | ORAL | Status: AC
  Administered 2019-04-07: 02:00:00 2 mg via ORAL
  Filled 2019-04-07: qty 2

## 2019-04-07 NOTE — ED Notes (Signed)
Pts status has now been switched from Voluntary to IVC. Pt is sleeping comfortably at this time. Per RN discretion, Pt is allowed to retain [place] in room for comfort measures to access television in room. Pt remains non-hostile, cooperative and is not SI/HI.

## 2019-04-07 NOTE — BH Assessment (Signed)
Tele Assessment Note   Patient Name: Ronnie Wilson MRN: [id number] Referring Physician: Merrily Pew, MD Location of Patient: [facility], [facility] Location of Provider: [facility]  Ronnie Wilson is an 43 y.o. divorced male who presents unaccompanied to [facility] ED with paranoid ideation and visual hallucinations. Pt is extremely anxious and fearful, hiding under the blanket and repeatedly stating there are people trying to kill him. He point to areas in the ED stating that a person is there and is going to kill him and RN states there is no one there. Pt says he use one line of methamphetamines five days ago and since then he has not slept. He says people are trying to get into his house to kill him and he can see and hear them. He says he cannot leave the house. He states he has called law enforcement four times and they say there is no one there. Pt denies current suicidal ideation or history of suicide attempts. Pt says "If I wanted to die I would just let them get me." He denies current thoughts of harming others or history of violence. Pt has a history of using substances in the past. His urine drug screen is positive for amphetamines.  Pt reports he was released from prison in September after being incarcerated on drug-related charges. He says he is living with his mother. Pt states he thinks people are trying to harm him because he will not get back together with his ex-wife. He denies any history of inpatient or outpatient mental health or substance abuse treatment.  Pt is dressed in hospital scrubs and hiding under blankets. He is alert and oriented person and place but not time or situation. Pt speaks in a clear tone, at moderate volume and rapid pace. Motor behavior appears restless and Pt is hypervigilant. Eye contact is minimal because Pt is looking about the room. Pt's mood is extremely anxious and fearful and affect is congruent with mood. Thought process is  circumstantial. Pt appears to be responding to internal stimuli and experiencing paranoid thought content.   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 Chronic back pain     Past Surgical History:  Procedure Laterality Date  . arm surgery  left     tendon and laceration repair  . SHOULDER SURGERY      Family History: No family history on file.  Social History:  reports that he has been smoking cigarettes. He has been smoking about 1.50 packs per day. He has never used smokeless tobacco. He reports that he does not drink alcohol or use drugs.  Additional Social History:  Alcohol / Drug Use Pain Medications: Denies abuse Prescriptions: Denies abuse Over the Counter: Denies abuse History of alcohol / drug use?: Yes(Pt has history of abusing methamphetamines and other substances) Longest period of sobriety (when/how long): Unknown Negative Consequences of Use: Legal, Financial, Personal relationships, Work / School  CIWA: CIWA-Ar BP: (!) 171/93 Pulse Rate: 90 COWS:    Allergies: No Known Allergies  Home Medications: (Not in a hospital admission)   OB/GYN Status:  No LMP for male patient.  General Assessment Data Location of Assessment: [facility] TTS Assessment: In system Is this a Tele or Face-to-Face Assessment?: Tele Assessment Is this an Initial Assessment or a Re-assessment for this encounter?: Initial Assessment Patient Accompanied by:: N/A Language Other  than English: No Living Arrangements: Other (Comment)(Staying with mother) What gender do you identify as?: Male Marital status: Divorced Jordan name: NA Pregnancy Status: No Living Arrangements: Parent Can pt return to current living arrangement?: Yes Admission Status: Voluntary Is patient capable of signing voluntary admission?: Yes Referral Source:  Self/Family/Friend Insurance type: Self-pay     Crisis Care Plan Living Arrangements: Parent Legal Guardian: Other:(Self) Name of Psychiatrist: None Name of Therapist: None  Education Status Is patient currently in school?: No Is the patient employed, unemployed or receiving disability?: Unemployed  Risk to self with the past 6 months Suicidal Ideation: No Has patient been a risk to self within the past 6 months prior to admission? : No Suicidal Intent: No Has patient had any suicidal intent within the past 6 months prior to admission? : No Is patient at risk for suicide?: No Suicidal Plan?: No Has patient had any suicidal plan within the past 6 months prior to admission? : No Access to Means: No What has been your use of drugs/alcohol within the last 12 months?: Pt reports using methamphetamines Previous Attempts/Gestures: No How many times?: 0 Other Self Harm Risks: None Triggers for Past Attempts: None known Intentional Self Injurious Behavior: None Family Suicide History: Unknown Recent stressful life event(s): Other (Comment)(Recently released from prison) Persecutory voices/beliefs?: Yes Depression: Yes Depression Symptoms: Insomnia, Isolating, Loss of interest in usual pleasures Substance abuse history and/or treatment for substance abuse?: Yes Suicide prevention information given to non-admitted patients: Not applicable  Risk to Others within the past 6 months Homicidal Ideation: No Does patient have any lifetime risk of violence toward others beyond the six months prior to admission? : No Thoughts of Harm to Others: No Current Homicidal Intent: No Current Homicidal Plan: No Access to Homicidal Means: No Identified Victim: None History of harm to others?: No Assessment of Violence: None Noted Violent Behavior Description: Pt denies history of aggression Does patient have access to weapons?: No Criminal Charges Pending?: No Does patient have a court date:  No Is patient on probation?: No  Psychosis Hallucinations: Visual Delusions: Persecutory  Mental Status Report Appearance/Hygiene: Other (Comment)(Covered by blanket) Eye Contact: Poor Motor Activity: Restlessness, Other (Comment)(Hypervigilant) Speech: Rapid Level of Consciousness: Restless, Alert Mood: Anxious, Fearful, Terrified Affect: Anxious, Fearful Anxiety Level: Severe Thought Processes: Circumstantial Judgement: Impaired Orientation: Person, Place Obsessive Compulsive Thoughts/Behaviors: None  Cognitive Functioning Concentration: Poor Memory: Unable to Assess Is patient IDD: No Insight: Poor Impulse Control: Poor Appetite: Poor Have you had any weight changes? : (Unable to assess) Sleep: Decreased Total Hours of Sleep: 0(Pt reports no sleep in 5 days) Vegetative Symptoms: None  ADLScreening [facility] Assessment Services) Patient's cognitive ability adequate to safely complete daily activities?: Yes Patient able to express need for assistance with ADLs?: Yes Independently performs ADLs?: Yes (appropriate for developmental age)  Prior Inpatient Therapy Prior Inpatient Therapy: No  Prior Outpatient Therapy Prior Outpatient Therapy: No Does patient have an ACCT team?: No Does patient have Intensive In-House Services?  : No Does patient have [facility] services? : No Does patient have P4CC services?: No  ADL Screening (condition at time of admission) Patient's cognitive ability adequate to safely complete daily activities?: Yes Is the patient deaf or have difficulty hearing?: No Does the patient have difficulty seeing, even when wearing glasses/contacts?: No Does the patient have difficulty concentrating, remembering, or making decisions?: Yes Patient able to express need for assistance with ADLs?: Yes Does the patient have difficulty dressing or bathing?: No Independently performs ADLs?: Yes (  appropriate for developmental age) Does the patient have difficulty  walking or climbing stairs?: No Weakness of Legs: None Weakness of Arms/Hands: None  Home Assistive Devices/Equipment Home Assistive Devices/Equipment: None    Abuse/Neglect Assessment (Assessment to be complete while patient is alone) Abuse/Neglect Assessment Can Be Completed: Unable to assess, patient is non-responsive or altered mental status     Advance Directives (For Healthcare) Does Patient Have a Medical Advance Directive?: Unable to assess, patient is non-responsive or altered mental status          Disposition: Binnie RailJoAnn Glover, [facility], confirmed adult unit is currently at capacity. Gave clinical report to Nira ConnJason Berry, NP who said Pt meets criteria for inpatient psychiatric treatment. TTS will contact other facilities for placement. Notified Dr Marily MemosJason Mesner and Cherylin MylarJeffrey Sappelt, RN of recommendation.  Disposition Initial Assessment Completed for this Encounter: Yes  This service was provided via telemedicine using a 2-way, interactive audio and video technology.  Names of all persons participating in this telemedicine service and their role in this encounter. Name: Ronnie DeerMichael Mustard Role: Patient  Name: Shela CommonsFord Jacqueline Spofford Jr, [facility] Role: TTS counselor         Harlin RainFord Ellis Patsy BaltimoreWarrick Jr, [facility], [facility] Triage Specialist [phone]  Pamalee LeydenWarrick Jr, Mylon Mabey Ellis 04/07/2019 2:36 AM

## 2019-04-07 NOTE — ED Notes (Signed)
ED Provider at bedside. 

## 2019-04-07 NOTE — ED Notes (Signed)
Pt has been changed into hospital provided scrubs. Items secured safely in Pt belongings locker. Security has wanded the Pt and the room has been made secure. Pt is not suicidal at this time and remains voluntary. Pt is not SI/HI and will be allowed to retain call bell to change television channel and volume settings. Pt is cooperative with this RN and provided extra blanket.

## 2019-04-07 NOTE — ED Notes (Signed)
Security and TTS consult at bedside.

## 2019-04-07 NOTE — ED Provider Notes (Signed)
Emergency Department Provider Note   I have reviewed the triage vital signs and the nursing notes.   HISTORY  Chief Complaint Medical Clearance   HPI Ronnie Wilson is a 43 y.o. male who presents to the emergency department today secondary to hallucinations.  Patient states that he thinks people are out to kill him and that they are actually outside of his house.  He states he has been off last couple days.  Is never had this before.  States he sits in his living room with the lights off in the porch lights on and can see them coming after him and hears them talking about him.  No history of psychiatric issues.   No other associated or modifying symptoms.    Past Medical History:  Diagnosis Date  . Chronic back pain     There are no active problems to display for this patient.   Past Surgical History:  Procedure Laterality Date  . arm surgery  left     tendon and laceration repair  . SHOULDER SURGERY      Current Outpatient Rx  . Order #: [id number] Class: Historical Med  . Order #: [id number] Class: Print    Allergies Patient has no known allergies.  No family history on file.  Social History Social History   Tobacco Use  . Smoking status: Current Every Day Smoker    Packs/day: 1.50    Types: Cigarettes  . Smokeless tobacco: Never Used  Substance Use Topics  . Alcohol use: No  . Drug use: No    Review of Systems  All other systems negative except as documented in the HPI. All pertinent positives and negatives as reviewed in the HPI. ____________________________________________   PHYSICAL EXAM:  VITAL SIGNS: ED Triage Vitals  Enc Vitals Group     BP 04/07/19 0051 (!) 171/93     Pulse Rate 04/07/19 0051 90     Resp 04/07/19 0051 20     Temp 04/07/19 0051 98.7 F (37.1 C)     Temp Source 04/07/19 0051 Oral     SpO2 --      Weight 04/07/19 0052 145 lb (65.8 kg)     Height 04/07/19 0052 5\' 3"  (1.6 m)     Head Circumference --      Peak Flow --       Pain Score 04/07/19 0052 0     Pain Loc --      Pain Edu? --      Excl. in GC? --     Constitutional: Alert and oriented. Well appearing and in no acute distress. Eyes: Conjunctivae are normal. PERRL. EOMI. Head: Atraumatic. Nose: No congestion/rhinnorhea. Mouth/Throat: Mucous membranes are moist.  Oropharynx non-erythematous. Neck: No stridor.  No meningeal signs.   Cardiovascular: Normal rate, regular rhythm. Good peripheral circulation. Grossly normal heart sounds.   Respiratory: Normal respiratory effort.  No retractions. Lungs CTAB. Gastrointestinal: Soft and nontender. No distention.  Musculoskeletal: No lower extremity tenderness nor edema. No gross deformities of extremities. Neurologic:  Normal speech and language. No gross focal neurologic deficits are appreciated.  Skin:  Skin is warm, dry and intact. No rash noted.  ____________________________________________   LABS (all labs ordered are listed, but only abnormal results are displayed)  Labs Reviewed  COMPREHENSIVE METABOLIC PANEL - Abnormal; Notable for the following components:      Result Value   Sodium 134 (*)    Potassium 3.3 (*)    Glucose, Bld 110 (*)  All other components within normal limits  RAPID URINE DRUG SCREEN, HOSP PERFORMED - Abnormal; Notable for the following components:   Amphetamines POSITIVE (*)    All other components within normal limits  CBC WITH DIFFERENTIAL/PLATELET - Abnormal; Notable for the following components:   WBC 13.2 (*)    RBC 3.93 (*)    Hemoglobin 12.1 (*)    HCT 36.8 (*)    Platelets 424 (*)    Neutro Abs 9.0 (*)    All other components within normal limits  ACETAMINOPHEN LEVEL - Abnormal; Notable for the following components:   Acetaminophen (Tylenol), Serum <10 (*)    All other components within normal limits  ETHANOL  SALICYLATE LEVEL   ____________________________________________  EKG   EKG Interpretation  Date/Time:  Saturday April 07 2019  01:02:12 EST Ventricular Rate:  81 PR Interval:    QRS Duration: 76 QT Interval:  350 QTC Calculation: 407 R Axis:   66 Text Interpretation: Sinus rhythm Anteroseptal infarct, age indeterminate Baseline wander in lead(s) V3 No old tracing to compare Confirmed by Merrily Pew [phone]) on 04/07/2019 2:34:58 AM       ____________________________________________  RADIOLOGY  No results found.  ____________________________________________   PROCEDURES  Procedure(s) performed:   Procedures   ____________________________________________   INITIAL IMPRESSION / ASSESSMENT AND PLAN / ED COURSE  Here with acute psychosis. Slightly elevated BP likely related to seeing things and concern they are going to kill him, will continue to monitor. Will consult TTS as I anticipate he will need inpatient management and will be medically cleared.   Medically cleared. IVC'ed. Plan for inpatient. D/w NP Gwenlyn Found who will make med recommendations.   Pertinent labs & imaging results that were available during my care of the patient were reviewed by me and considered in my medical decision making (see chart for details).  ___________________________________________  FINAL CLINICAL IMPRESSION(S) / ED DIAGNOSES  Final diagnoses:  None     MEDICATIONS GIVEN DURING THIS VISIT:  Medications  albuterol (VENTOLIN HFA) 108 (90 Base) MCG/ACT inhaler 2 puff (2 puffs Inhalation Given 04/07/19 0249)  ibuprofen (ADVIL) tablet 600 mg (has no administration in time range)  OLANZapine (ZYPREXA) tablet 10 mg (has no administration in time range)  OLANZapine zydis (ZYPREXA) disintegrating tablet 10 mg (has no administration in time range)  LORazepam (ATIVAN) tablet 2 mg (2 mg Oral Given 04/07/19 0207)  OLANZapine zydis (ZYPREXA) disintegrating tablet 10 mg (10 mg Oral Given 04/07/19 0250)     NEW OUTPATIENT MEDICATIONS STARTED DURING THIS VISIT:  New Prescriptions   No medications on file    Note:   This note was prepared with assistance of Dragon voice recognition software. Occasional wrong-word or sound-a-like substitutions may have occurred due to the inherent limitations of voice recognition software.   Seriyah Collison, Corene Cornea, MD 04/07/19 [phone]
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
## 2019-04-07 NOTE — ED Notes (Signed)
Pt admits to use Meth a few days ago when he was with an old friend but nothing else since then. Pt informed he is in a safe place and staff are here to help.

## 2019-04-07 NOTE — ED Notes (Signed)
Pt ambulated to bathroom 

## 2019-04-07 NOTE — ED Triage Notes (Signed)
Pt states he recently got out of prison, states for the past week he has been seeing people outside of his house that he feels are trying to hurt him.  Pt states he thinks it is because he will not get back together with his ex-wife.  Pt denies physical pain or other complaints.  Pt denies SI and HI.

## 2019-04-07 NOTE — ED Notes (Signed)
Pt has denied any hallucinations today.

## 2019-04-07 NOTE — Progress Notes (Signed)
Patient meets criteria for inpatient treatment. CSW faxed referrals to the following facilities for review:   [facility]   [facility]   [facility]   [facility]   [facility]   [facility]   [facility]   [facility]   [facility]   [facility]   [facility]   [facility]   [facility]   [facility]   [facility]   [facility]   [facility]   [facility]   [facility]   [facility]   [facility]   [facility]  [facility]     TSS will continue to seek bed placement.   Stacye Noori MSW, LCSWA Clincal Social Worker Disposition  [facility] Ph: [phone] Fax: [phone]  

## 2019-04-07 NOTE — Progress Notes (Addendum)
Patient ID: Ronnie Wilson, male   DOB: 1975-10-16, 43 y.o.   MRN: [id number]  Ronnie Wilson is an 43 y.o. divorced male who presents unaccompanied to [facility] ED with paranoid ideation and visual hallucinations. Pt is extremely anxious and fearful, hiding under the blanket and repeatedly stating there are people trying to kill him. He point to areas in the ED stating that a person is there and is going to kill him and RN states there is no one there. Pt says he used one line of methamphetamines five days ago and since then he has not slept. He says people are trying to get into his house to kill him and he can see and hear them. He says he cannot leave the house. He states he has called law enforcement four times and they say there is no one there. Pt denies current suicidal ideation or history of suicide attempts. Pt says "If I wanted to die I would just let them get me." He denies current thoughts of harming others or history of violence. Pt has a history of using substances in the past. His urine drug screen is positive for amphetamines.  Patient is paranoid, experiencing visual hallucinations of people coming through the walls. UDS is positive for amphetamines. Psychosis likely related to amphetamine use. EKG- Sinus Rhythm, QTC-407. Recommend starting Zyprexa 10 mg QHS, first dose now. Recommend Zyprexa (Zydis) 10 mg prn for agitation.

## 2019-04-08 NOTE — B[facility] Counselor (Signed)
  Reassessment  [facility] reevaluated pt for safety and stability.  Pt was observed sitting up in the bed, appeared hypervigilant as evident of pt looking from side to side.  Pt was oriented x2. Pt admits having A/V-hallucinations.  Pt states "I keep hearing voices and seeing people.  I think someone is trying to kill me." P denies SI/HI.  Pt continues to meet inpatient criteria.  TTS will continue to look for inpatient placement.  Arno Cullers L. [place], [place], [facility], [facility] Therapeutic Triage Specialist  [phone]

## 2019-04-08 NOTE — ED Notes (Signed)
Pt given zyprexa to help calm him down

## 2019-04-08 NOTE — ED Notes (Signed)
Pt states that he want the remote to the TV and he wants to get out of here. He was explained the situation. They stated that they will have to arrest me I am getting out of here. The officer working with [facility] went into to talk to the patient.

## 2019-04-09 LAB — SARS CORONAVIRUS 2 BY RT PCR (HOSPITAL ORDER, PERFORMED IN ~~LOC~~ HOSPITAL LAB): SARS Coronavirus 2: NEGATIVE

## 2019-04-09 MED ORDER — LORAZEPAM 1 MG PO TABS
1.0000 mg | ORAL_TABLET | Freq: Once | ORAL | Status: AC
  Administered 2019-04-09: 1 mg via ORAL
  Filled 2019-04-09: qty 1

## 2019-04-09 NOTE — ED Notes (Signed)
Patient provided with meal tray at this time per request of Jinny Blossom, RN.

## 2019-04-09 NOTE — BH Assessment (Signed)
Reassessment note: Pt presents lying in bed and moved to sitting on side of bed. Pt states he is feeling somewhat better today. He denies SI, HI & AVH. Pt continues to endorse paranoid thoughts. Continued inpt psychiatric tx recommended.

## 2019-04-09 NOTE — BH Assessment (Signed)
Per Jonelle Sidle, patient accepted to [facility] (main campus) 04/10/2019 after 9am. The accepting provider is Dr. Jonelle Sports. Nurse report is [phone].

## 2019-04-10 NOTE — ED Provider Notes (Signed)
Patient accepted by [facility] for psychiatric inpatient care.  Accepting provider is Dr. Selinda Flavin.   Fredia Sorrow, MD 04/10/19 [phone]

## 2019-06-14 ENCOUNTER — Ambulatory Visit: Payer: PRIVATE HEALTH INSURANCE | Attending: Internal Medicine

## 2019-06-14 ENCOUNTER — Other Ambulatory Visit: Payer: Self-pay

## 2019-06-14 DIAGNOSIS — Z20822 Contact with and (suspected) exposure to covid-19: Secondary | ICD-10-CM

## 2019-06-15 LAB — NOVEL CORONAVIRUS, NAA: SARS-CoV-2, NAA: NOT DETECTED

## 2019-06-17 ENCOUNTER — Encounter (HOSPITAL_COMMUNITY): Payer: Self-pay | Admitting: *Deleted

## 2019-06-17 ENCOUNTER — Other Ambulatory Visit: Payer: Self-pay

## 2019-06-17 ENCOUNTER — Emergency Department (HOSPITAL_COMMUNITY)
Admission: EM | Admit: 2019-06-17 | Discharge: 2019-06-18 | Disposition: A | Discharge status: Other Acute Inpatient Hospital | Payer: PRIVATE HEALTH INSURANCE | Attending: Emergency Medicine | Admitting: Emergency Medicine

## 2019-06-17 DIAGNOSIS — F23 Brief psychotic disorder: Secondary | ICD-10-CM

## 2019-06-17 DIAGNOSIS — F22 Delusional disorders: Secondary | ICD-10-CM

## 2019-06-17 DIAGNOSIS — Z20822 Contact with and (suspected) exposure to covid-19: Secondary | ICD-10-CM | POA: Insufficient documentation

## 2019-06-17 DIAGNOSIS — F15259 Other stimulant dependence with stimulant-induced psychotic disorder, unspecified: Secondary | ICD-10-CM | POA: Insufficient documentation

## 2019-06-17 DIAGNOSIS — F17210 Nicotine dependence, cigarettes, uncomplicated: Secondary | ICD-10-CM | POA: Insufficient documentation

## 2019-06-17 LAB — COMPREHENSIVE METABOLIC PANEL
ALT: 11 U/L (ref 0–44)
AST: 17 U/L (ref 15–41)
Albumin: 4.4 g/dL (ref 3.5–5.0)
Alkaline Phosphatase: 86 U/L (ref 38–126)
Anion gap: 11 (ref 5–15)
BUN: 16 mg/dL (ref 6–20)
CO2: 24 mmol/L (ref 22–32)
Calcium: 9.6 mg/dL (ref 8.9–10.3)
Chloride: 102 mmol/L (ref 98–111)
Creatinine, Ser: 1.03 mg/dL (ref 0.61–1.24)
GFR calc Af Amer: >60 mL/min (ref >60)
GFR calc non Af Amer: >60 mL/min (ref >60)
Glucose, Bld: 111 mg/dL — ABNORMAL HIGH (ref 70–99)
Potassium: 3 mmol/L — ABNORMAL LOW (ref 3.5–5.1)
Sodium: 137 mmol/L (ref 135–145)
Total Bilirubin: 0.5 mg/dL (ref 0.3–1.2)
Total Protein: 8.2 g/dL — ABNORMAL HIGH (ref 6.5–8.1)

## 2019-06-17 LAB — CBC
HCT: 41 % (ref 39.0–52.0)
Hemoglobin: 13.7 g/dL (ref 13.0–17.0)
MCH: 31.2 pg (ref 26.0–34.0)
MCHC: 33.4 g/dL (ref 30.0–36.0)
MCV: 93.4 fL (ref 80.0–100.0)
Platelets: 430 10*3/uL — ABNORMAL HIGH (ref 150–400)
RBC: 4.39 MIL/uL (ref 4.22–5.81)
RDW: 13.4 % (ref 11.5–15.5)
WBC: 11.8 10*3/uL — ABNORMAL HIGH (ref 4.0–10.5)
nRBC: 0 % (ref 0.0–0.2)

## 2019-06-17 LAB — RAPID URINE DRUG SCREEN, HOSP PERFORMED
Amphetamines: POSITIVE — AB
Barbiturates: NOT DETECTED
Benzodiazepines: NOT DETECTED
Cocaine: NOT DETECTED
Opiates: NOT DETECTED
Tetrahydrocannabinol: NOT DETECTED

## 2019-06-17 LAB — RESPIRATORY PANEL BY RT PCR (FLU A&B, COVID)
Influenza A by PCR: NEGATIVE
Influenza B by PCR: NEGATIVE
SARS Coronavirus 2 by RT PCR: NEGATIVE

## 2019-06-17 LAB — ETHANOL: Alcohol, Ethyl (B): <10 mg/dL (ref <10)

## 2019-06-17 MED ORDER — POTASSIUM CHLORIDE CRYS ER 20 MEQ PO TBCR
40.0000 meq | EXTENDED_RELEASE_TABLET | Freq: Once | ORAL | Status: AC
  Administered 2019-06-17: 40 meq via ORAL
  Filled 2019-06-17: qty 2
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
## 2019-06-17 NOTE — Discharge Instructions (Signed)
Transport to [facility] for admission, attending is Dr Jama Flavors
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
## 2019-06-18 ENCOUNTER — Inpatient Hospital Stay (HOSPITAL_COMMUNITY)
Admission: AD | Admit: 2019-06-18 | Discharge: 2019-06-19 | DRG: 897 | Disposition: A | Discharge status: Home / Self Care | Payer: Federal, State, Local not specified - Other | Source: Intra-hospital | Attending: Psychiatry | Admitting: Psychiatry

## 2019-06-18 ENCOUNTER — Other Ambulatory Visit: Payer: Self-pay

## 2019-06-18 ENCOUNTER — Encounter (HOSPITAL_COMMUNITY): Payer: Self-pay | Admitting: Psychiatric/Mental Health

## 2019-06-18 DIAGNOSIS — F15159 Other stimulant abuse with stimulant-induced psychotic disorder, unspecified: Principal | ICD-10-CM | POA: Diagnosis present

## 2019-06-18 DIAGNOSIS — F209 Schizophrenia, unspecified: Secondary | ICD-10-CM | POA: Diagnosis present

## 2019-06-18 DIAGNOSIS — F17210 Nicotine dependence, cigarettes, uncomplicated: Secondary | ICD-10-CM | POA: Diagnosis present

## 2019-06-18 DIAGNOSIS — Z20822 Contact with and (suspected) exposure to covid-19: Secondary | ICD-10-CM | POA: Diagnosis present

## 2019-06-18 DIAGNOSIS — G4700 Insomnia, unspecified: Secondary | ICD-10-CM | POA: Diagnosis present

## 2019-06-18 DIAGNOSIS — F15951 Other stimulant use, unspecified with stimulant-induced psychotic disorder with hallucinations: Secondary | ICD-10-CM

## 2019-06-18 DIAGNOSIS — Z9119 Patient's noncompliance with other medical treatment and regimen: Secondary | ICD-10-CM | POA: Diagnosis not present

## 2019-06-18 DIAGNOSIS — F15959 Other stimulant use, unspecified with stimulant-induced psychotic disorder, unspecified: Secondary | ICD-10-CM | POA: Diagnosis present

## 2019-06-18 MED ORDER — OLANZAPINE 10 MG PO TBDP
10.0000 mg | ORAL_TABLET | Freq: Once | ORAL | Status: AC
  Administered 2019-06-18: 03:00:00 10 mg via ORAL
  Filled 2019-06-18 (×2): qty 1

## 2019-06-18 MED ORDER — HYDROXYZINE HCL 25 MG PO TABS
ORAL_TABLET | ORAL | Status: AC
  Filled 2019-06-18: qty 1

## 2019-06-18 MED ORDER — OLANZAPINE 5 MG PO TBDP
5.0000 mg | ORAL_TABLET | Freq: Two times a day (BID) | ORAL | Status: DC
  Administered 2019-06-18 – 2019-06-19 (×2): 5 mg via ORAL
  Filled 2019-06-18 (×6): qty 1

## 2019-06-18 MED ORDER — ALBUTEROL SULFATE HFA 108 (90 BASE) MCG/ACT IN AERS
2.0000 | INHALATION_SPRAY | RESPIRATORY_TRACT | Status: DC | PRN
  Administered 2019-06-18 – 2019-06-19 (×2): 2 via RESPIRATORY_TRACT
  Filled 2019-06-18: qty 6.7

## 2019-06-18 MED ORDER — TRAZODONE HCL 50 MG PO TABS: 50.0000 mg | ORAL_TABLET | Freq: Every evening | ORAL | Status: DC | PRN

## 2019-06-18 MED ORDER — TRAZODONE HCL 100 MG PO TABS
100.0000 mg | ORAL_TABLET | Freq: Once | ORAL | Status: AC
  Administered 2019-06-18: 03:00:00 100 mg via ORAL
  Filled 2019-06-18 (×2): qty 1

## 2019-06-18 MED ORDER — ACETAMINOPHEN 325 MG PO TABS: 650.0000 mg | ORAL_TABLET | Freq: Four times a day (QID) | ORAL | Status: DC | PRN

## 2019-06-18 MED ORDER — HYDROXYZINE HCL 25 MG PO TABS
25.0000 mg | ORAL_TABLET | Freq: Three times a day (TID) | ORAL | Status: DC | PRN
  Administered 2019-06-18 (×2): 25 mg via ORAL
  Filled 2019-06-18: qty 1

## 2019-06-18 MED ORDER — ALUM & MAG HYDROXIDE-SIMETH 200-200-20 MG/5ML PO SUSP: 30.0000 mL | ORAL | Status: DC | PRN

## 2019-06-18 MED ORDER — TRAZODONE HCL 150 MG PO TABS
150.0000 mg | ORAL_TABLET | Freq: Every day | ORAL | Status: DC
  Administered 2019-06-18: 21:00:00 150 mg via ORAL
  Filled 2019-06-18 (×4): qty 1

## 2019-06-18 MED ORDER — MAGNESIUM HYDROXIDE 400 MG/5ML PO SUSP: 30.0000 mL | Freq: Every day | ORAL | Status: DC | PRN

## 2019-06-18 MED ORDER — TRAZODONE HCL 50 MG PO TABS
ORAL_TABLET | ORAL | Status: AC
  Administered 2019-06-18: 50 mg
  Filled 2019-06-18: qty 1

## 2019-06-18 NOTE — Progress Notes (Signed)
Dar Note: Patient presents with anxious affect and paranoid behavior.   Medication given as prescribed.  Patient remained in his room for majority of this shift.  Routine safety checks maintained every 15 minutes.  Offered support and encouragement as needed.  Patient is safe on the unit.

## 2019-06-18 NOTE — [facility] Suicide Risk Assessment (Signed)
[facility] Admission Suicide Risk Assessment   Nursing information obtained from:  Patient Demographic factors:  Male, Caucasian, Divorced or widowed Current Mental Status:  NA Loss Factors:  NA Historical Factors:  NA Risk Reduction Factors:  Living with another person, especially a relative  Total Time spent with patient: 45 minutes Principal Problem: <principal problem not specified> Diagnosis:  Active Problems:   Amphetamine-induced psychotic disorder (HCC)  Subjective Data: Methamphetamine induced psychosis  Continued Clinical Symptoms:  Alcohol Use Disorder Identification Test Final Score (AUDIT): 0 The "Alcohol Use Disorders Identification Test", Guidelines for Use in Primary Care, Second Edition.  World Science writer [facility]). Score between 0-7:  no or low risk or alcohol related problems. Score between 8-15:  moderate risk of alcohol related problems. Score between 16-19:  high risk of alcohol related problems. Score 20 or above:  warrants further diagnostic evaluation for alcohol dependence and treatment.   CLINICAL FACTORS:   Schizophrenia:   Paranoid or undifferentiated type   Musculoskeletal: Strength & Muscle Tone: within normal limits Gait & Station: normal Patient leans: N/A  Psychiatric Specialty Exam: Physical Exam  Nursing note and vitals reviewed. Constitutional: He appears well-developed and well-nourished.    Review of Systems  Constitutional: Negative.   Endocrine: Negative.   Genitourinary: Negative.   Neurological: Negative.   Hematological: Negative.     Blood pressure (!) 133/91, pulse (!) 129, temperature 98.7 F (37.1 C), temperature source Oral, resp. rate 18, height 5\' 5"  (1.651 m), weight 63.5 kg.Body mass index is 23.3 kg/m.  General Appearance: Disheveled  Eye Contact:  Minimal  Speech:  Slow  Volume:  Decreased  Mood:  Dysphoric  Affect:  Restricted  Thought Process:  Disorganized  Orientation:  Other:  Person place situation   Thought Content:  Delusions and Hallucinations: Auditory Visual  Suicidal Thoughts:  No  Homicidal Thoughts:  No  Memory:  Immediate;   Fair Recent;   Poor Remote;   Fair  Judgement:  Poor  Insight:  Lacking  Psychomotor Activity:  Decreased  Concentration:  Concentration: Poor and Attention Span: Poor  Recall:  Poor  Fund of Knowledge:  Poor  Language:  Poor  Akathisia:  Negative  Handed:  Right  AIMS (if indicated):     Assets:  Communication Skills Desire for Improvement Physical Health Resilience Social Support  ADL's:  Intact  Cognition:  WNL  Sleep:  Number of Hours: 0    COGNITIVE FEATURES THAT CONTRIBUTE TO RISK:  Loss of executive function    SUICIDE RISK:   Minimal: No identifiable suicidal ideation.  Patients presenting with no risk factors but with morbid ruminations; may be classified as minimal risk based on the severity of the depressive symptoms  PLAN OF CARE: See eval  I certify that inpatient services furnished can reasonably be expected to improve the patient's condition.   , MD 06/18/2019, 11:35 AM

## 2019-06-18 NOTE — Tx Team (Signed)
Initial Treatment Plan 06/18/2019 3:18 AM Ronnie Wilson Ronnie Wilson [id number]    PATIENT STRESSORS: Medication change or noncompliance Substance abuse   PATIENT STRENGTHS: Ability for insight Supportive family/friends   PATIENT IDENTIFIED PROBLEMS: Anxiety   Substance abuse (meth) last used 1/22        Patient is extremely paranoid and no goals at present           DISCHARGE CRITERIA:  Improved stabilization in mood, thinking, and/or behavior Need for constant or close observation no longer present  PRELIMINARY DISCHARGE PLAN: Return to previous living arrangement  PATIENT/FAMILY INVOLVEMENT: This treatment plan has been presented to and reviewed with the patient, Ronnie Wilson, and/or family member.  The patient and family have been given the opportunity to ask questions and make suggestions.  Floyce Stakes, RN 06/18/2019, 3:18 AM

## 2019-06-18 NOTE — Tx Team (Signed)
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 acetaminophen (TYLENOL) tablet 650 mg  650 mg Oral Q6H PRN Dixon, Rashaun M, NP      . alum & mag hydroxide-simeth (MAALOX/MYLANTA) 200-200-20 MG/5ML suspension 30 mL  30 mL Oral Q4H PRN Dixon, Rashaun M, NP      . hydrOXYzine (ATARAX/VISTARIL) tablet 25 mg  25 mg Oral TID PRN Deloria Lair, NP   25 mg at 06/18/19 0214  . magnesium hydroxide (MILK OF MAGNESIA) suspension 30 mL  30 mL Oral Daily PRN Dixon, Rashaun M, NP      . traZODone (DESYREL) tablet 50 mg  50 mg Oral QHS PRN Deloria Lair, NP       PTA Medications: Medications Prior to Admission  Medication Sig Dispense Refill Last Dose  . albuterol (PROVENTIL HFA;VENTOLIN HFA) 108 (90 BASE) MCG/ACT inhaler Inhale 2 puffs into the lungs every 6 (six) hours as needed for wheezing or shortness of breath.   Unknown at Unknown time  . ibuprofen (ADVIL,MOTRIN) 600 MG tablet Take 1 tablet (600 mg total) by mouth every 6 (six) hours as needed. 30 tablet 0 Unknown at Unknown time    Patient Stressors: Medication change or noncompliance Substance abuse  Patient Strengths: Ability for insight Supportive family/friends  Treatment Modalities: Medication Management, Group therapy, Case management,  1 to 1 session with clinician, Psychoeducation, Recreational therapy.   Physician Treatment Plan for Primary Diagnosis: <principal problem not specified> Long Term Goal(s):     Short Term Goals:    Medication Management: Evaluate patient's response, side effects, and tolerance of medication regimen.  Therapeutic Interventions: 1 to 1  sessions, Unit Group sessions and Medication administration.  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  Therapeutic Interventions: 1 to 1 sessions, Unit Group sessions and Medication administration.  Evaluation of Outcomes: Not Met   RN Treatment Plan for Primary Diagnosis: <principal problem not specified> Long Term Goal(s): Knowledge of disease and therapeutic regimen to maintain health will improve  Short Term Goals: Ability to participate in decision making will improve, Ability to verbalize feelings will improve, Ability to disclose and discuss suicidal ideas, Ability to identify and develop effective coping behaviors will improve and Compliance with prescribed medications will improve  Medication Management: RN will administer medications as ordered by provider, will assess and evaluate patient's response and provide education to patient for prescribed medication. RN will report any adverse and/or side effects to prescribing provider.  Therapeutic Interventions: 1 on 1 counseling sessions, Psychoeducation, Medication administration, Evaluate responses to treatment, Monitor vital signs and CBGs as ordered, Perform/monitor CIWA, COWS, AIMS and Fall Risk screenings as ordered, Perform wound care treatments as ordered.  Evaluation of Outcomes: Not Met   LCSW Treatment Plan for Primary Diagnosis: <principal problem not specified> Long Term Goal(s): Safe transition to appropriate next level of care at discharge, Engage patient in therapeutic group addressing interpersonal concerns.  Short Term Goals: Engage patient in aftercare planning with referrals and resources and  Increase skills for wellness and recovery  Therapeutic  Interventions: Assess for all discharge needs, 1 to 1 time with Social worker, Explore available resources and support systems, Assess for adequacy in community support network, Educate family and significant other(s) on suicide prevention, Complete Psychosocial Assessment, Interpersonal group therapy.  Evaluation of Outcomes: Not Met   Progress in Treatment: Attending groups: As evidenced by:  Patient has not been on the unit long enough for groups, yet Participating in groups: As evidenced by:  Patient has not been on the unit long enough for groups, yet Taking medication as prescribed: Yes. Toleration medication: Yes. Family/Significant other contact made: No, will contact:  will contact if given consent to contact Patient understands diagnosis: No. Discussing patient identified problems/goals with staff: Yes. Medical problems stabilized or resolved: Yes. Denies suicidal/homicidal ideation: Yes. Issues/concerns per patient self-inventory: No. Other:   New problem(s) identified: No, Describe:  None  New Short Term/Long Term Goal(s): Medication stabilization, elimination of SI thoughts, and development of a comprehensive mental wellness plan.   Patient Goals:  Patient did not come to treatment team.   Discharge Plan or Barriers: CSW will continue to follow up for appropriate referrals and possible discharge planning  Reason for Continuation of Hospitalization: Hallucinations Medication stabilization  Estimated Length of Stay: 5-7 days   Attendees: Patient: Ronnie Wilson  06/18/2019   Physician:  06/18/2019  Nursing: Benjamine Mola, RN 06/18/2019   RN Care Manager: 06/18/2019  Social Worker: Ardelle Anton, LCSW  06/18/2019  Recreational Therapist:  06/18/2019   Nurse Practitioner: Dema Severin, NP  06/18/2019   Other:  06/18/2019   Other: 06/18/2019     Scribe for Treatment Team: Trecia Rogers, LCSW 06/18/2019 11:10 AM

## 2019-06-18 NOTE — Progress Notes (Signed)
   06/18/19 2200  Psych Admission Type (Psych Patients Only)  Admission Status Voluntary  Psychosocial Assessment  Patient Complaints Anxiety  Eye Contact Suspiciousness;Watchful;Staring;Darting  Facial Expression Anxious  Affect Anxious;Fearful;Frightened;Preoccupied  Speech Press photographer;Hyperactive;Pacing;Restless;Tremors  Appearance/Hygiene In scrubs  Behavior Characteristics Guarded  Mood Anxious;Suspicious  Thought Process  Coherency Tangential;Circumstantial  Content Delusions;Obsessions;Preoccupation;Paranoia  Delusions Paranoid  Perception Hallucinations  Hallucination Visual  Judgment Impaired  Confusion Severe  Danger to Self  Current suicidal ideation? Denies  Danger to Others  Danger to Others None reported or observed

## 2019-06-18 NOTE — [facility] Group Notes (Signed)
[facility] LCSW Group Therapy Note  Date/Time: 06/18/2019 @ 1:30pm  Type of Therapy/Topic:  Group Therapy:  Feelings about Diagnosis  Participation Level:  Minimal   Mood: Pleasant   Description of Group:    This group will allow patients to explore their thoughts and feelings about diagnoses they have received. Patients will be guided to explore their level of understanding and acceptance of these diagnoses. Facilitator will encourage patients to process their thoughts and feelings about the reactions of others to their diagnosis, and will guide patients in identifying ways to discuss their diagnosis with significant others in their lives. This group will be process-oriented, with patients participating in exploration of their own experiences as well as giving and receiving support and challenge from other group members.   Therapeutic Goals: 1. Patient will demonstrate understanding of diagnosis as evidence by identifying two or more symptoms of the disorder:  2. Patient will be able to express two feelings regarding the diagnosis 3. Patient will demonstrate ability to communicate their needs through discussion and/or role plays  Summary of Patient Progress:    Patient was engaged throughout group therapy and listened actively. Patient did not talk much but did discuss how he felt when he was diagnosed with a mental illness diagnosis. Patient responded, "It is what it is".     Therapeutic Modalities:   Cognitive Behavioral Therapy Brief Therapy Feelings Identification   Stephannie Peters, LCSW

## 2019-06-18 NOTE — Progress Notes (Signed)
Recreation Therapy Notes  Date: 1.25.21 Time: 1000 Location: 500 Hall  Group Topic: Goal Setting  Goal Area(s) Addresses:  Patient will be able to identify at least 3 life goals.  Patient will be able to identify benefit of investing in life goals.  Patient will be able to identify benefit of setting life goals.   Intervention: Worksheet  Activity: Garment/textile technologist.  Patients were to identify goals they hoped to accomplish in a week, month, year and five years.  Patients were to then to identify obstacles to reaching goals, what they need to achieve goals and what they can start doing now to work towards goals.  Education:  Discharge Planning, Pharmacologist, Leisure Education   Education Outcome: Acknowledges Education/In Group Clarification Provided/Needs Additional Education  Clinical Observations:  Pt did not attend group.    Caroll Rancher, LRT/CTRS         Caroll Rancher A 06/18/2019 11:25 AM
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
## 2019-06-18 NOTE — Progress Notes (Signed)
Recreation Therapy Notes  INPATIENT RECREATION THERAPY ASSESSMENT  Patient Details Name: Ronnie Wilson MRN: [id number] DOB: 1976-04-06 Today's Date: 06/18/2019       Information Obtained From: Patient  Able to Participate in Assessment/Interview: Yes  Patient Presentation: Alert  Reason for Admission (Per Patient): Other (Comments)(Needed mental help)  Patient Stressors: (None identified)  Coping Skills:   Sports, TV, Exercise, Music, Substance Abuse, Talk, Prayer, Avoidance  Leisure Interests (2+):  Exercise - Walking, Individual - Other (Comment)(Sleep)  Frequency of Recreation/Participation: Other (Comment)(Daily)  Awareness of Community Resources:  Yes  Community Resources:  Library, Research scientist (physical sciences)  Current Use: No  If no, Barriers?: (No reason given)  Expressed Interest in [employer] Information: No  [place] of Residence:  [place]  Patient Main Form of Transportation: Car  Patient Strengths:  Work Associate Professor; Work good with others  Patient Identified Areas of Improvement:  Staying cool; Not getting into nothing else  Patient Goal for Hospitalization:  "get it complete and go back home"  Current SI (including self-harm):  No  Current HI:  No  Current AVH: No  Staff Intervention Plan: Group Attendance, Collaborate with Interdisciplinary Treatment Team  Consent to Intern Participation: N/A    Caroll Rancher, LRT/CTRS  Caroll Rancher A 06/18/2019, 11:57 AM

## 2019-06-19 MED ORDER — OLANZAPINE 20 MG PO TABS: 20.0000 mg | ORAL_TABLET | Freq: Every day | ORAL | 1 refills | Status: DC

## 2019-06-19 MED ORDER — OLANZAPINE 20 MG PO TABS: 20.0000 mg | ORAL_TABLET | Freq: Every day | ORAL | 0 refills | Status: DC

## 2019-06-19 MED ORDER — OLANZAPINE 10 MG PO TABS
20.0000 mg | ORAL_TABLET | Freq: Every day | ORAL | Status: DC
  Filled 2019-06-19 (×2): qty 14

## 2019-06-19 NOTE — [facility] Suicide Risk Assessment (Signed)
[facility] Discharge Suicide Risk Assessment   Principal Problem: Psychosis/substance-induced Discharge Diagnoses: Active Problems:   Amphetamine-induced psychotic disorder (HCC)   Total Time spent with patient: 45 minutes  Musculoskeletal: Strength & Muscle Tone: within normal limits Gait & Station: normal Patient leans: N/A  Psychiatric Specialty Exam: Review of Systems  Blood pressure (!) 88/61, pulse 83, temperature 98.4 F (36.9 C), temperature source Oral, resp. rate 18, height 5\' 5"  (1.651 m), weight 63.5 kg.Body mass index is 23.3 kg/m.  General Appearance: Casual  Eye Contact::  Good  Speech:  Clear and Coherent409  Volume:  Normal  Mood:  Dysphoric  Affect:  Restricted  Thought Process:  Coherent and Goal Directed  Orientation:  Full (Time, Place, and Person)  Thought Content:  Tangential  Suicidal Thoughts:  No  Homicidal Thoughts:  No  Memory:  Immediate;   Fair Recent;   Fair Remote;   Fair  Judgement:  Fair  Insight:  Fair  Psychomotor Activity:  Normal  Concentration:  Fair  Recall:  [id number] of Knowledge:Fair  Language: Fair  Akathisia:  Negative  Handed:  Right  AIMS (if indicated):     Assets:  Communication Skills  Sleep:  Number of Hours: 7.75  Cognition: WNL  ADL's:  Intact   Mental Status Per Nursing Assessment::   On Admission:  NA  Demographic Factors:  Male  Loss Factors: Decrease in vocational status  Historical Factors: Impulsivity  Risk Reduction Factors:   Sense of responsibility to family and Religious beliefs about death  Continued Clinical Symptoms:  Alcohol/Substance Abuse/Dependencies  Cognitive Features That Contribute To Risk:  Polarized thinking    Suicide Risk:  Minimal: No identifiable suicidal ideation.  Patients presenting with no risk factors but with morbid ruminations; may be classified as minimal risk based on the severity of the depressive symptoms    Plan Of Care/Follow-up recommendations:  Activity:   full  Vidalia Serpas, MD 06/19/2019, 8:56 AM

## 2019-06-19 NOTE — Progress Notes (Signed)
Pt discharged to lobby. Pt was stable and appreciative at that time. All papers, samples and prescriptions were given and valuables returned. Verbal understanding expressed. Denies SI/HI and A/VH. Pt given opportunity to express concerns and ask questions.  

## 2019-06-19 NOTE — Progress Notes (Signed)
  [facility] Adult Case Management Discharge Plan :  Will you be returning to the same living situation after discharge:  Yes,  pt's home At discharge, do you have transportation home?: Yes,  pt's friend Do you have the ability to pay for your medications: No.; [facility]  Release of information consent forms completed and in the chart;  Patient's signature needed at discharge.  Patient to Follow up at: Follow-up Information    Services, [facility]. Go on 06/22/2019.   Why: Your appointment with Floydene Flock is in person on 06/22/19 at 9:30 am.([address]) Please bring: hospital discharge paperwork, insurance card, social security card or other photo ID, any household income information. Contact information: [address] [place] [address] [phone]           Next level of care provider has access to [facility] Link:no  Safety Planning and Suicide Prevention discussed: No.; Patient declined SPE; with pt  Have you used any form of tobacco in the last 30 days? (Cigarettes, Smokeless Tobacco, Cigars, and/or Pipes): Yes  Has patient been referred to the Quitline?: Patient refused referral  Patient has been referred for addiction treatment: Yes  Delphia Grates, LCSW 06/19/2019, 10:40 AM
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
## 2019-06-19 NOTE — [facility] Counselor (Signed)
Patient discharged within 24 hours of admission. CSW was unable to complete assessment. Patient will be following up with [facility] in [place], [place].

## 2019-06-19 NOTE — [facility] Suicide Risk Assessment (Signed)
[facility] INPATIENT:  Family/Significant Other Suicide Prevention Education  Suicide Prevention Education:  Patient Refusal for Family/Significant Other Suicide Prevention Education: The patient Ronnie Wilson has refused to provide written consent for family/significant other to be provided Family/Significant Other Suicide Prevention Education during admission and/or prior to discharge.  Physician notified.  Delphia Grates 06/19/2019, 9:56 AM

## 2019-12-19 ENCOUNTER — Encounter (HOSPITAL_COMMUNITY): Payer: Self-pay | Admitting: *Deleted

## 2019-12-19 ENCOUNTER — Other Ambulatory Visit: Payer: Self-pay

## 2019-12-19 ENCOUNTER — Emergency Department (HOSPITAL_COMMUNITY)
Admission: EM | Admit: 2019-12-19 | Discharge: 2019-12-19 | Disposition: A | Discharge status: Home / Self Care | Payer: Self-pay | Attending: Emergency Medicine | Admitting: Emergency Medicine

## 2019-12-19 DIAGNOSIS — R531 Weakness: Secondary | ICD-10-CM | POA: Insufficient documentation

## 2019-12-19 DIAGNOSIS — M545 Low back pain: Secondary | ICD-10-CM | POA: Insufficient documentation

## 2019-12-19 DIAGNOSIS — Z5321 Procedure and treatment not carried out due to patient leaving prior to being seen by health care provider: Secondary | ICD-10-CM | POA: Insufficient documentation

## 2019-12-19 NOTE — ED Triage Notes (Signed)
Pt c/o feeling weak and having a hard time catching his breath and c/o lower back pain x 3 days

## 2019-12-19 NOTE — ED Notes (Signed)
Called no answer

## 2019-12-20 ENCOUNTER — Other Ambulatory Visit: Payer: Self-pay

## 2019-12-20 ENCOUNTER — Emergency Department (HOSPITAL_COMMUNITY)
Admission: EM | Admit: 2019-12-20 | Discharge: 2019-12-20 | Disposition: A | Discharge status: Home / Self Care | Payer: Self-pay | Attending: Emergency Medicine | Admitting: Emergency Medicine

## 2019-12-20 DIAGNOSIS — M791 Myalgia, unspecified site: Secondary | ICD-10-CM | POA: Insufficient documentation

## 2019-12-20 DIAGNOSIS — R531 Weakness: Secondary | ICD-10-CM | POA: Insufficient documentation

## 2019-12-20 DIAGNOSIS — Z5321 Procedure and treatment not carried out due to patient leaving prior to being seen by health care provider: Secondary | ICD-10-CM | POA: Insufficient documentation

## 2019-12-20 NOTE — ED Triage Notes (Signed)
Body aches, feels weak  S/s started on Monday

## 2019-12-21 ENCOUNTER — Ambulatory Visit
Admission: EM | Admit: 2019-12-21 | Discharge: 2019-12-21 | Disposition: A | Discharge status: Home / Self Care | Payer: Self-pay | Attending: Family Medicine | Admitting: Family Medicine

## 2019-12-21 ENCOUNTER — Other Ambulatory Visit: Payer: Self-pay

## 2019-12-21 DIAGNOSIS — R52 Pain, unspecified: Secondary | ICD-10-CM | POA: Insufficient documentation

## 2019-12-21 DIAGNOSIS — R6883 Chills (without fever): Secondary | ICD-10-CM | POA: Insufficient documentation

## 2019-12-21 DIAGNOSIS — R300 Dysuria: Secondary | ICD-10-CM | POA: Insufficient documentation

## 2019-12-21 DIAGNOSIS — R109 Unspecified abdominal pain: Secondary | ICD-10-CM | POA: Insufficient documentation

## 2019-12-21 LAB — POCT URINALYSIS DIP (MANUAL ENTRY)
Bilirubin, UA: NEGATIVE
Glucose, UA: NEGATIVE mg/dL
Ketones, POC UA: NEGATIVE mg/dL
Nitrite, UA: NEGATIVE
Spec Grav, UA: 1.01 (ref 1.010–1.025)
Urobilinogen, UA: 1 E.U./dL
pH, UA: 6.5 (ref 5.0–8.0)

## 2019-12-21 MED ORDER — NITROFURANTOIN MONOHYD MACRO 100 MG PO CAPS: 100.0000 mg | ORAL_CAPSULE | Freq: Two times a day (BID) | ORAL | 0 refills | Status: DC

## 2019-12-21 MED ORDER — PHENAZOPYRIDINE HCL 200 MG PO TABS: 200.0000 mg | ORAL_TABLET | Freq: Three times a day (TID) | ORAL | 0 refills | Status: DC

## 2019-12-21 NOTE — ED Provider Notes (Signed)
[facility]   CC: UTI  SUBJECTIVE:  Ronnie Wilson is a 44 y.o. male who complains of urinary frequency, urgency, left flank pain and dysuria for the past 5 days. Patient reports history of kidney infection in the past. Patient denies a precipitating event, recent sexual encounter, excessive caffeine intake. Localizes the pain to the lower abdomen and left flank.  Pain is constant and describes it as  burning. Has not taken OTC medications for this. Symptoms are made worse with urination. Denies nausea, vomiting, diarrhea, hematuria.    ROS: As in HPI.  All other pertinent ROS negative.     Past Medical History:  Diagnosis Date  . Chronic back pain    Past Surgical History:  Procedure Laterality Date  . arm surgery  left     tendon and laceration repair  . SHOULDER SURGERY     No Known Allergies No current facility-administered medications on file prior to encounter.   Current Outpatient Medications on File Prior to Encounter  Medication Sig Dispense Refill  . albuterol (PROVENTIL HFA;VENTOLIN HFA) 108 (90 BASE) MCG/ACT inhaler Inhale 2 puffs into the lungs every 6 (six) hours as needed for wheezing or shortness of breath.    . OLANZapine (ZYPREXA) 20 MG tablet Take 1 tablet (20 mg total) by mouth at bedtime. 30 tablet 0   Social History   Socioeconomic History  . Marital status: Divorced    Spouse name: Not on file  . Number of children: 2  . Years of education: Not on file  . Highest education level: Not on file  Occupational History  . Not on file  Tobacco Use  . Smoking status: Current Every Day Smoker    Packs/day: 1.50    Types: Cigarettes  . Smokeless tobacco: Never Used  Substance and Sexual Activity  . Alcohol use: No  . Drug use: No  . Sexual activity: Not on file  Other Topics Concern  . Not on file  Social History Narrative  . Not on file   Social Determinants of Health   Financial Resource Strain:   . Difficulty of Paying Living  Expenses:   Food Insecurity:   . Worried About Programme researcher, broadcasting/film/video in the Last Year:   . Barista in the Last Year:   Transportation Needs:   . Freight forwarder (Medical):   Marland Kitchen Lack of Transportation (Non-Medical):   Physical Activity:   . Days of Exercise per Week:   . Minutes of Exercise per Session:   Stress:   . Feeling of Stress :   Social Connections:   . Frequency of Communication with Friends and Family:   . Frequency of Social Gatherings with Friends and Family:   . Attends Religious Services:   . Active Member of Clubs or Organizations:   . Attends Banker Meetings:   Marland Kitchen Marital Status:   Intimate Partner Violence:   . Fear of Current or Ex-Partner:   . Emotionally Abused:   Marland Kitchen Physically Abused:   . Sexually Abused:    History reviewed. No pertinent family history.  OBJECTIVE:  Vitals:   12/21/19 1112  BP: (!) 116/86  Pulse: 98  Resp: 18  Temp: 99.4 F (37.4 C)  SpO2: 99%   General appearance: fatigued, ill appearing, nontoxic HEENT: NCAT.  Oropharynx clear.  Lungs: clear to auscultation bilaterally without adventitious breath sounds Heart: regular rate and rhythm.  Radial pulses 2+ symmetrical bilaterally Abdomen: soft; non-distended; suprapubic tenderness;  bowel sounds present; no guarding or rebound tenderness Back: CVA tenderness present Extremities: no edema; symmetrical with no gross deformities Skin: warm and dry Neurologic: Ambulates from chair to exam table without difficulty Psychological: alert and cooperative; normal mood and affect  Labs Reviewed  POCT URINALYSIS DIP (MANUAL ENTRY) - Abnormal; Notable for the following components:      Result Value   Blood, UA trace-intact (*)    Protein Ur, POC trace (*)    Leukocytes, UA Small (1+) (*)    All other components within normal limits  URINE CULTURE  POCT URINALYSIS DIP (MANUAL ENTRY)    ASSESSMENT & PLAN:  1. Dysuria   2. Left flank pain   3. Chills   4.  Body aches     Meds ordered this encounter  Medications  . phenazopyridine (PYRIDIUM) 200 MG tablet    Sig: Take 1 tablet (200 mg total) by mouth 3 (three) times daily.    Dispense:  6 tablet    Refill:  0    Order Specific Question:   Supervising Provider    Answer:   Merrilee Jansky [id number]  . nitrofurantoin, macrocrystal-monohydrate, (MACROBID) 100 MG capsule    Sig: Take 1 capsule (100 mg total) by mouth 2 (two) times daily.    Dispense:  10 capsule    Refill:  0    Order Specific Question:   Supervising Provider    Answer:   Merrilee Jansky [id number]    High suspicion for pyelonephritis given presentation, fever and chills Urine culture sent We will call you with abnormal results that need further treatment.   Push fluids and get plenty of rest.   Prescribed Macrobid Prescribed Pyridium Take antibiotic as directed and to completion Take pyridium as prescribed and as needed for symptomatic relief Follow up with PCP if symptoms persists Return here or go to ER if you have any new or worsening symptoms such as fever, worsening abdominal pain, nausea/vomiting, flank pain  Outlined signs and symptoms indicating need for more acute intervention. Patient verbalized understanding. After Visit Summary given.     Moshe Cipro, NP 12/21/19 1139

## 2019-12-21 NOTE — Discharge Instructions (Addendum)
I suspect that you have a kidney infection  I have sent in an antibiotic for you to take twice a day for 5 days  I have also sent in pyridium for you to take 3 times per day for the next 2-3 days as needed for urinary discomfort  We are going to culture your urine and will call you as soon as we have the results.   Drink plenty of water, 8-10 glasses per day.   Follow up with your primary care provider as needed.   Go to the Emergency Department if you experience severe pain, shortness of breath, high fever, or other concerns.

## 2019-12-21 NOTE — ED Triage Notes (Signed)
Pt presents with c/o low back pain and some dysuria that began on Monday

## 2019-12-24 ENCOUNTER — Emergency Department (HOSPITAL_COMMUNITY)
Admission: EM | Admit: 2019-12-24 | Discharge: 2019-12-24 | Disposition: A | Discharge status: Home / Self Care | Payer: Self-pay | Attending: Emergency Medicine | Admitting: Emergency Medicine

## 2019-12-24 ENCOUNTER — Encounter (HOSPITAL_COMMUNITY): Payer: Self-pay | Admitting: *Deleted

## 2019-12-24 ENCOUNTER — Emergency Department (HOSPITAL_COMMUNITY): Payer: Self-pay

## 2019-12-24 DIAGNOSIS — G8929 Other chronic pain: Secondary | ICD-10-CM

## 2019-12-24 DIAGNOSIS — M25511 Pain in right shoulder: Secondary | ICD-10-CM | POA: Insufficient documentation

## 2019-12-24 DIAGNOSIS — F17210 Nicotine dependence, cigarettes, uncomplicated: Secondary | ICD-10-CM | POA: Insufficient documentation

## 2019-12-24 DIAGNOSIS — M7918 Myalgia, other site: Secondary | ICD-10-CM | POA: Insufficient documentation

## 2019-12-24 DIAGNOSIS — E860 Dehydration: Secondary | ICD-10-CM | POA: Insufficient documentation

## 2019-12-24 DIAGNOSIS — R42 Dizziness and giddiness: Secondary | ICD-10-CM | POA: Insufficient documentation

## 2019-12-24 DIAGNOSIS — Z20822 Contact with and (suspected) exposure to covid-19: Secondary | ICD-10-CM | POA: Insufficient documentation

## 2019-12-24 LAB — URINALYSIS, ROUTINE W REFLEX MICROSCOPIC
Bilirubin Urine: NEGATIVE
Glucose, UA: NEGATIVE mg/dL
Ketones, ur: 20 mg/dL — AB
Leukocytes,Ua: NEGATIVE
Nitrite: NEGATIVE
Protein, ur: NEGATIVE mg/dL
Specific Gravity, Urine: 1.011 (ref 1.005–1.030)
pH: 7 (ref 5.0–8.0)

## 2019-12-24 LAB — CBC
HCT: 37.9 % — ABNORMAL LOW (ref 39.0–52.0)
Hemoglobin: 12.9 g/dL — ABNORMAL LOW (ref 13.0–17.0)
MCH: 30.7 pg (ref 26.0–34.0)
MCHC: 34 g/dL (ref 30.0–36.0)
MCV: 90.2 fL (ref 80.0–100.0)
Platelets: 315 10*3/uL (ref 150–400)
RBC: 4.2 MIL/uL — ABNORMAL LOW (ref 4.22–5.81)
RDW: 13.5 % (ref 11.5–15.5)
WBC: 6.8 10*3/uL (ref 4.0–10.5)
nRBC: 0 % (ref 0.0–0.2)

## 2019-12-24 LAB — BASIC METABOLIC PANEL
Anion gap: 11 (ref 5–15)
BUN: 8 mg/dL (ref 6–20)
CO2: 25 mmol/L (ref 22–32)
Calcium: 8.8 mg/dL — ABNORMAL LOW (ref 8.9–10.3)
Chloride: 92 mmol/L — ABNORMAL LOW (ref 98–111)
Creatinine, Ser: 0.87 mg/dL (ref 0.61–1.24)
GFR calc Af Amer: >60 mL/min (ref >60)
GFR calc non Af Amer: >60 mL/min (ref >60)
Glucose, Bld: 113 mg/dL — ABNORMAL HIGH (ref 70–99)
Potassium: 4.2 mmol/L (ref 3.5–5.1)
Sodium: 128 mmol/L — ABNORMAL LOW (ref 135–145)

## 2019-12-24 LAB — URINE CULTURE: Culture: 10000 — AB

## 2019-12-24 MED ORDER — METHOCARBAMOL 500 MG PO TABS: 500.0000 mg | ORAL_TABLET | Freq: Two times a day (BID) | ORAL | 0 refills | Status: DC

## 2019-12-24 MED ORDER — SODIUM CHLORIDE 0.9 % IV BOLUS
1000.0000 mL | Freq: Once | INTRAVENOUS | Status: AC
  Administered 2019-12-24: 1000 mL via INTRAVENOUS

## 2019-12-24 MED ORDER — KETOROLAC TROMETHAMINE 15 MG/ML IJ SOLN
15.0000 mg | Freq: Once | INTRAMUSCULAR | Status: AC
  Administered 2019-12-24: 15 mg via INTRAVENOUS
  Filled 2019-12-24: qty 1

## 2019-12-24 MED ORDER — METHOCARBAMOL 500 MG PO TABS
500.0000 mg | ORAL_TABLET | Freq: Once | ORAL | Status: AC
  Administered 2019-12-24: 500 mg via ORAL
  Filled 2019-12-24: qty 1

## 2019-12-24 NOTE — ED Provider Notes (Signed)
[facility] EMERGENCY DEPARTMENT Provider Note   CSN: [id number] Arrival date & time: 12/24/19  1044     History Chief Complaint  Patient presents with  . Generalized Body Aches    Ronnie Wilson is a 44 y.o. male R shoulder pain, generalized body aches, generalized malaise.  Patient states that he has had right shoulder pain for a while however since Friday has been constant and more severe.  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  He states that he has had surgery on his left shoulder because it kept "locking up" several years ago.  He states that the symptoms are somewhat similar.  Pain is worse with range of motion.  He does not have any pain when he touches the area.  The pain sometimes shoots up his shoulder to his neck.  He denies any numbness, tingling, weakness of the arm.  He also endorses feeling weak, dizzy, lightheaded for some time which has been gradually worsening.  He thinks he may be dehydrated.  He does work outside but states he tries to drink a lot of water and Gatorade.  Today he got lightheaded and he fell over and landed on his right elbow.  He denies any pain in the right elbow but worsening pain in the shoulder.  He denies fever, chills, chest pain, shortness of breath, abdominal pain. He was having some urinary symptoms and flank pain last week and went to UC and was treated for a UTI. Urine culture was obtained which showed 10,000 E.coli  HPI     Past Medical History:  Diagnosis Date  . Chronic back pain     Patient Active Problem List   Diagnosis Date Noted  . 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 arm surgery  left     tendon and laceration repair  . SHOULDER SURGERY         History reviewed. No pertinent family history.  Social History   Tobacco Use  . Smoking status: Current Every Day Smoker    Packs/day: 1.50    Types: Cigarettes  . Smokeless  tobacco: Never Used  Substance Use Topics  . Alcohol use: No  . Drug use: No    Home Medications Prior to Admission medications   Medication Sig Start Date End Date Taking? Authorizing Provider  albuterol (PROVENTIL HFA;VENTOLIN HFA) 108 (90 BASE) MCG/ACT inhaler Inhale 2 puffs into the lungs every 6 (six) hours as needed for wheezing or shortness of breath.    [provider]  nitrofurantoin, macrocrystal-monohydrate, (MACROBID) 100 MG capsule Take 1 capsule (100 mg total) by mouth 2 (two) times daily. 12/21/19   Moshe Cipro, NP  OLANZapine (ZYPREXA) 20 MG tablet Take 1 tablet (20 mg total) by mouth at bedtime. 06/19/19   Aldean Baker, NP  phenazopyridine (PYRIDIUM) 200 MG tablet Take 1 tablet (200 mg total) by mouth 3 (three) times daily. 12/21/19   Moshe Cipro, NP    Allergies    Patient has no known allergies.  Review of Systems   Review of Systems  Respiratory: Negative for shortness of breath.   Cardiovascular: Negative for chest pain.  Gastrointestinal: Negative for abdominal pain.  Musculoskeletal: Positive for arthralgias, myalgias and neck pain. Negative for joint swelling.  Neurological: Positive for dizziness and light-headedness. Negative for syncope, weakness and numbness.  All other systems reviewed and are negative.   Physical Exam Updated Vital  Signs BP 110/79 (BP Location: Right Arm)   Pulse 100   Temp 99.5 F (37.5 C) (Oral)   Resp 16   Ht 5\' 5"  (1.651 m)   Wt 63.5 kg   SpO2 99%   BMI 23.30 kg/m   Physical Exam Vitals and nursing note reviewed.  Constitutional:      General: He is not in acute distress.    Appearance: Normal appearance. He is well-developed. He is not ill-appearing.     Comments: Chronically ill-appearing no acute distress.  HENT:     Head: Normocephalic and atraumatic.  Eyes:     General: No scleral icterus.       Right eye: No discharge.        Left eye: No discharge.     Conjunctiva/sclera:  Conjunctivae normal.     Pupils: Pupils are equal, round, and reactive to light.  Neck:     Comments: No midline tenderness Cardiovascular:     Rate and Rhythm: Normal rate and regular rhythm.  Pulmonary:     Effort: Pulmonary effort is normal. No respiratory distress.     Breath sounds: Normal breath sounds.  Abdominal:     General: There is no distension.  Musculoskeletal:     Cervical back: Normal range of motion.     Comments: Right shoulder: No obvious swelling, deformity, or warmth. No tenderness. Pain is elicted with ROm of the shoulder. Pt can range to about 90 degrees flexion and abuction. 5/5 grip strength. Cap refill <2. N/V intact.   Skin:    General: Skin is warm and dry.  Neurological:     Mental Status: He is alert and oriented to person, place, and time.  Psychiatric:        Behavior: Behavior normal.     ED Results / Procedures / Treatments   Labs (all labs ordered are listed, but only abnormal results are displayed) Labs Reviewed  URINALYSIS, ROUTINE W REFLEX MICROSCOPIC - Abnormal; Notable for the following components:      Result Value   Hgb urine dipstick SMALL (*)    Ketones, ur 20 (*)    Bacteria, UA RARE (*)    All other components within normal limits  BASIC METABOLIC PANEL - Abnormal; Notable for the following components:   Sodium 128 (*)    Chloride 92 (*)    Glucose, Bld 113 (*)    Calcium 8.8 (*)    All other components within normal limits  CBC - Abnormal; Notable for the following components:   RBC 4.20 (*)    Hemoglobin 12.9 (*)    HCT 37.9 (*)    All other components within normal limits  URINE CULTURE    EKG EKG Interpretation  Date/Time:  Monday December 24 2019 12:49:03 EDT Ventricular Rate:  88 PR Interval:  154 QRS Duration: 86 QT Interval:  346 QTC Calculation: 418 R Axis:   69 Text Interpretation: Normal sinus rhythm Septal infarct , age undetermined Abnormal ECG No significant change since prior 11/20 Confirmed by  12/20 [phone]) on 12/24/2019 12:55:56 PM   Radiology DG Shoulder Right  Result Date: 12/24/2019 CLINICAL DATA:  Right shoulder pain EXAM: RIGHT SHOULDER - 2+ VIEW COMPARISON:  02/21/2015 FINDINGS: There is no evidence of fracture or dislocation. There is no evidence of arthropathy or other focal bone abnormality. Soft tissues are unremarkable. IMPRESSION: No acute abnormality noted. Electronically Signed   By: 02/23/2015 M.D.   On: 12/24/2019 13:36    Procedures  Procedures (including critical care time)  Medications Ordered in ED Medications  sodium chloride 0.9 % bolus 1,000 mL (0 mLs Intravenous Stopped 12/24/19 1529)  ketorolac (TORADOL) 15 MG/ML injection 15 mg (15 mg Intravenous Given 12/24/19 1255)  methocarbamol (ROBAXIN) tablet 500 mg (500 mg Oral Given 12/24/19 1254)    ED Course  I have reviewed the triage vital signs and the nursing notes.  Pertinent labs & imaging results that were available during my care of the patient were reviewed by me and considered in my medical decision making (see chart for details).  45 year old who presents with acute on chronic R shoulder pain and feeling dehydrated. His vital signs are normal here. Temp is mildly elevated but no true fever. Otherwise vitals are reassuring. He is generally well appearing. Has pain with ROM of the shoulder but no signs of trauma and no tenderness with palpation. Labs were obtained which are consistent with mild dehydration. He has 20 ketones in the urine but no signs of UTI. EKG is NSR. Will obtain Xray of the shoulder, give fluids and meds for pain and reassess  Xray is negative. Will place pt in sling and give rx for muscle relaxer. Will obtain COVID test due to malaise and borderline fever. Advised f/u with orthopedics.  Lissandro Dilorenzo Corado was evaluated in Emergency Department on 12/24/2019 for the symptoms described in the history of present illness. He was evaluated in the context of the global COVID-19 pandemic,  which necessitated consideration that the patient might be at risk for infection with the SARS-CoV-2 virus that causes COVID-19. Institutional protocols and algorithms that pertain to the evaluation of patients at risk for COVID-19 are in a state of rapid change based on information released by regulatory bodies including the CDC and federal and state organizations. These policies and algorithms were followed during the patient's care in the ED.   MDM Rules/Calculators/A&P                           Final Clinical Impression(s) / ED Diagnoses Final diagnoses:  Chronic right shoulder pain  Mild dehydration    Rx / DC Orders ED Discharge Orders    None       Bethel Born, PA-C 12/24/19 1542    Terrilee Files, MD 12/24/19 1827

## 2019-12-24 NOTE — Discharge Instructions (Signed)
Take Tylenol or Ibuprofen for pain as needed Take Robaxin for muscle pain and stiffness. This medicine can make you drowsy Wear sling for the next couple of days. Then start range of motion exercises to prevent worsening stiffness Please make a follow up appointment with orthopedics

## 2019-12-24 NOTE — ED Triage Notes (Signed)
Aching all over for 3-4 days, states is body os swollen, recently started on medication for UTI

## 2019-12-24 NOTE — Clinical Social Work Note (Signed)
Transition of Care [facility]) - Emergency Department Mini Assessment  Patient Details  Name: Ronnie Wilson MRN: [id number] Date of Birth: Mar 30, 1976  [facility]) CM/SW Contact:    Ewing Schlein, LCSW Phone Number: 12/24/2019, 3:34 PM  Clinical Narrative: Patient is a 44 year old male who presented to the ED for body aches. Per chart review, patient does not have insurance or a PCP. CSW called patient to discuss referrals to [facility] for PCP assistance for uninsured patients and the financial counselor to determine if patient is eligible for Medicaid or another financial assistance program. Patient agreeable to both referrals. CSW called [facility] to make referral. CSW made referral to financial counselor, Ronnie Wilson. TOC signing off.  ED Mini Assessment: What brought you to the Emergency Department? : Bodyaches Barriers to Discharge: Continued Medical Work up [employer] interventions: Referrals to [employer] and financial counselor Means of departure: Car Interventions which prevented an admission or readmission: Other (must enter comment) (Referrals to [facility] and financial counselor)  Patient Surveyor, quantity 1: [facility] Key Contact 2: Financial counselor Ronnie Wilson) Contact Date: 12/24/19     Call outcome: Referrals made  Admission diagnosis:  bodyaches Patient Active Problem List   Diagnosis Date Noted  . 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 [place] [address] Phone: [phone] Fax: [phone]  [id number] - [place], [place] - [address] AT [facility] OF [address] [place] [address] Phone: [phone] Fax: [phone]

## 2019-12-24 NOTE — ED Notes (Signed)
Patient transported to CT 

## 2019-12-25 LAB — URINE CULTURE: Culture: NO GROWTH

## 2019-12-25 LAB — SARS CORONAVIRUS 2 (TAT 6-24 HRS): SARS Coronavirus 2: NEGATIVE

## 2020-05-19 ENCOUNTER — Encounter (HOSPITAL_COMMUNITY): Payer: Self-pay | Admitting: Emergency Medicine

## 2020-05-19 ENCOUNTER — Emergency Department (HOSPITAL_COMMUNITY)
Admission: EM | Admit: 2020-05-19 | Discharge: 2020-05-19 | Disposition: A | Discharge status: Home / Self Care | Payer: BC Managed Care – PPO | Attending: Emergency Medicine | Admitting: Emergency Medicine

## 2020-05-19 ENCOUNTER — Other Ambulatory Visit: Payer: Self-pay

## 2020-05-19 DIAGNOSIS — Z5321 Procedure and treatment not carried out due to patient leaving prior to being seen by health care provider: Secondary | ICD-10-CM | POA: Diagnosis not present

## 2020-05-19 DIAGNOSIS — R300 Dysuria: Secondary | ICD-10-CM | POA: Insufficient documentation

## 2020-05-19 NOTE — ED Triage Notes (Signed)
Pt c/o painful urination x3 days.  Pt also states he has pus at times.

## 2020-11-07 ENCOUNTER — Other Ambulatory Visit: Payer: Self-pay

## 2020-11-07 ENCOUNTER — Emergency Department (HOSPITAL_COMMUNITY)
Admission: EM | Admit: 2020-11-07 | Discharge: 2020-11-07 | Disposition: A | Discharge status: Home / Self Care | Payer: BC Managed Care – PPO | Attending: Emergency Medicine | Admitting: Emergency Medicine

## 2020-11-07 ENCOUNTER — Encounter (HOSPITAL_COMMUNITY): Payer: Self-pay

## 2020-11-07 DIAGNOSIS — F17210 Nicotine dependence, cigarettes, uncomplicated: Secondary | ICD-10-CM | POA: Insufficient documentation

## 2020-11-07 DIAGNOSIS — M5431 Sciatica, right side: Secondary | ICD-10-CM | POA: Insufficient documentation

## 2020-11-07 MED ORDER — KETOROLAC TROMETHAMINE 60 MG/2ML IM SOLN
60.0000 mg | Freq: Once | INTRAMUSCULAR | Status: AC
  Administered 2020-11-07: 02:00:00 60 mg via INTRAMUSCULAR
  Filled 2020-11-07: qty 2

## 2020-11-07 MED ORDER — PREDNISONE 10 MG PO TABS: 20.0000 mg | ORAL_TABLET | Freq: Two times a day (BID) | ORAL | 0 refills | Status: DC

## 2020-11-07 MED ORDER — OXYCODONE-ACETAMINOPHEN 5-325 MG PO TABS
2.0000 | ORAL_TABLET | Freq: Once | ORAL | Status: AC
  Administered 2020-11-07: 02:00:00 2 via ORAL
  Filled 2020-11-07: qty 2

## 2020-11-07 MED ORDER — PREDNISONE 50 MG PO TABS
60.0000 mg | ORAL_TABLET | Freq: Once | ORAL | Status: AC
  Administered 2020-11-07: 02:00:00 60 mg via ORAL
  Filled 2020-11-07: qty 1

## 2020-11-07 MED ORDER — HYDROCODONE-ACETAMINOPHEN 5-325 MG PO TABS: 1.0000 | ORAL_TABLET | Freq: Four times a day (QID) | ORAL | 0 refills | Status: DC | PRN

## 2020-11-07 NOTE — Discharge Instructions (Addendum)
Begin taking prednisone as prescribed and hydrocodone as prescribed as needed for pain.  Follow-up with a primary doctor if your symptoms or not improving in the next 7 to 10 days.

## 2020-11-07 NOTE — ED Provider Notes (Signed)
[facility] EMERGENCY DEPARTMENT Provider Note   CSN: [id number] Arrival date & time: 11/07/20  0036     History Chief Complaint  Patient presents with   Sciatica    Ronnie Wilson is a 45 y.o. male.  Patient is a 46 year old male with past medical history of chronic back pain.  He presents today for evaluation of sciatica.  He describes severe pain starting from his right buttock and radiating down into his right foot.  This began 1 week ago, but significantly worsened this evening.  He denies any weakness.  He denies any bowel or bladder complaints.  Patient lives down the street and tells me he was able to ambulate to the emergency department.  The history is provided by the patient.      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 No pertinent family history.  Social History   Tobacco Use   Smoking status: Every Day    Packs/day: 1.00    Pack years: 0.00    Types: Cigarettes   Smokeless tobacco: Never  Vaping Use   Vaping Use: Never used  Substance Use Topics   Alcohol use: No   Drug use: No    Home Medications Prior to Admission medications   Medication Sig Start Date End Date Taking? Authorizing Provider  albuterol (PROVENTIL HFA;VENTOLIN HFA) 108 (90 BASE) MCG/ACT inhaler Inhale 2 puffs into the lungs every 6 (six) hours as needed for wheezing or shortness of breath.    [provider]  methocarbamol (ROBAXIN) 500 MG tablet Take 1 tablet (500 mg total) by mouth 2 (two) times daily. 12/24/19   Bethel Born, PA-C  nitrofurantoin, macrocrystal-monohydrate, (MACROBID) 100 MG capsule Take 1 capsule (100 mg total) by mouth 2 (two) times daily. 12/21/19   Moshe Cipro, NP  OLANZapine (ZYPREXA) 20 MG tablet Take 1 tablet (20 mg total) by mouth at bedtime. 06/19/19   Aldean Baker, NP  phenazopyridine (PYRIDIUM) 200 MG tablet Take 1 tablet (200 mg total) by mouth 3 (three) times daily. 12/21/19   Moshe Cipro, NP    Allergies    Patient has no known allergies.  Review of Systems   Review of Systems  All other systems reviewed and are negative.  Physical Exam Updated Vital Signs BP (!) 151/97 (BP Location: Left Arm)   Pulse 85   Temp 98.9 F (37.2 C) (Oral)   Resp 18   Ht 5\' 6"  (1.676 m)   Wt 59 kg   SpO2 100%   BMI 20.98 kg/m   Physical Exam Vitals and nursing note reviewed.  Constitutional:      General: He is not in acute distress.    Appearance: Normal appearance.     Comments: Patient is a 45 year old male who appears uncomfortable.  HENT:     Head: Normocephalic and atraumatic.  Pulmonary:     Effort: Pulmonary effort is normal.  Musculoskeletal:     Comments: There is tenderness to palpation in the soft tissues of the right buttock and right lower lumbar region.  There is no bony tenderness or step-off.  Skin:    General: Skin  is warm and dry.  Neurological:     General: No focal deficit present.     Mental Status: He is alert and oriented to person, place, and time.     Comments: DTRs are 2+ and symmetrical in the patellar and Achilles tendons bilaterally.  Strength is 5 out of 5 in both lower extremities.  He is able to ambulate, but with an antalgic gait.    ED Results / Procedures / Treatments   Labs (all labs ordered are listed, but only abnormal results are displayed) Labs Reviewed - No data to display  EKG None  Radiology No results found.  Procedures Procedures   Medications Ordered in ED Medications  predniSONE (DELTASONE) tablet 60 mg (has no administration in time range)  ketorolac (TORADOL) injection 60 mg (has no administration in time range)  oxyCODONE-acetaminophen (PERCOCET/ROXICET)  5-325 MG per tablet 2 tablet (has no administration in time range)    ED Course  I have reviewed the triage vital signs and the nursing notes.  Pertinent labs & imaging results that were available during my care of the patient were reviewed by me and considered in my medical decision making (see chart for details).    MDM Rules/Calculators/A&P   Patient presenting here with complaints of severe back pain radiating into his right leg.  He reports a history of sciatica and this feels similar.  Patient strength and reflexes are symmetrical with no bowel or bladder issues.  He was able to ambulate here from down the street where he lives.  Patient given prednisone, Percocet, and Toradol.  He will be discharged with prednisone and hydrocodone.  Final Clinical Impression(s) / ED Diagnoses Final diagnoses:  None    Rx / DC Orders ED Discharge Orders     None        Geoffery Lyons, MD 11/07/20 [phone]

## 2020-11-07 NOTE — ED Triage Notes (Signed)
Pt arrived via POV c/o pain et numbess shooting down right leg from lower back. States that pain is a 10/10

## 2021-04-01 ENCOUNTER — Other Ambulatory Visit: Payer: Self-pay

## 2021-04-01 ENCOUNTER — Emergency Department (HOSPITAL_COMMUNITY)
Admission: EM | Admit: 2021-04-01 | Discharge: 2021-04-01 | Disposition: A | Discharge status: Home / Self Care | Payer: Self-pay | Attending: Emergency Medicine | Admitting: Emergency Medicine

## 2021-04-01 ENCOUNTER — Encounter (HOSPITAL_COMMUNITY): Payer: Self-pay | Admitting: Emergency Medicine

## 2021-04-01 DIAGNOSIS — R440 Auditory hallucinations: Secondary | ICD-10-CM | POA: Insufficient documentation

## 2021-04-01 DIAGNOSIS — F17210 Nicotine dependence, cigarettes, uncomplicated: Secondary | ICD-10-CM | POA: Insufficient documentation

## 2021-04-01 DIAGNOSIS — Z79899 Other long term (current) drug therapy: Secondary | ICD-10-CM | POA: Insufficient documentation

## 2021-04-01 DIAGNOSIS — Y900 Blood alcohol level of less than 20 mg/100 ml: Secondary | ICD-10-CM | POA: Insufficient documentation

## 2021-04-01 LAB — CBC WITH DIFFERENTIAL/PLATELET
Abs Immature Granulocytes: 0.02 10*3/uL (ref 0.00–0.07)
Basophils Absolute: 0.1 10*3/uL (ref 0.0–0.1)
Basophils Relative: 1 %
Eosinophils Absolute: 0.2 10*3/uL (ref 0.0–0.5)
Eosinophils Relative: 2 %
HCT: 36.8 % — ABNORMAL LOW (ref 39.0–52.0)
Hemoglobin: 12.7 g/dL — ABNORMAL LOW (ref 13.0–17.0)
Immature Granulocytes: 0 %
Lymphocytes Relative: 19 %
Lymphs Abs: 1.7 10*3/uL (ref 0.7–4.0)
MCH: 31.7 pg (ref 26.0–34.0)
MCHC: 34.5 g/dL (ref 30.0–36.0)
MCV: 91.8 fL (ref 80.0–100.0)
Monocytes Absolute: 0.7 10*3/uL (ref 0.1–1.0)
Monocytes Relative: 8 %
Neutro Abs: 6 10*3/uL (ref 1.7–7.7)
Neutrophils Relative %: 70 %
Platelets: 451 10*3/uL — ABNORMAL HIGH (ref 150–400)
RBC: 4.01 MIL/uL — ABNORMAL LOW (ref 4.22–5.81)
RDW: 13.4 % (ref 11.5–15.5)
WBC: 8.7 10*3/uL (ref 4.0–10.5)
nRBC: 0 % (ref 0.0–0.2)

## 2021-04-01 LAB — RAPID URINE DRUG SCREEN, HOSP PERFORMED
Amphetamines: POSITIVE — AB
Barbiturates: NOT DETECTED
Benzodiazepines: POSITIVE — AB
Cocaine: NOT DETECTED
Opiates: NOT DETECTED
Tetrahydrocannabinol: NOT DETECTED

## 2021-04-01 LAB — COMPREHENSIVE METABOLIC PANEL
ALT: 11 U/L (ref 0–44)
AST: 18 U/L (ref 15–41)
Albumin: 4 g/dL (ref 3.5–5.0)
Alkaline Phosphatase: 77 U/L (ref 38–126)
Anion gap: 8 (ref 5–15)
BUN: 14 mg/dL (ref 6–20)
CO2: 27 mmol/L (ref 22–32)
Calcium: 9.3 mg/dL (ref 8.9–10.3)
Chloride: 102 mmol/L (ref 98–111)
Creatinine, Ser: 0.92 mg/dL (ref 0.61–1.24)
GFR, Estimated: >60 mL/min (ref >60)
Glucose, Bld: 113 mg/dL — ABNORMAL HIGH (ref 70–99)
Potassium: 4.1 mmol/L (ref 3.5–5.1)
Sodium: 137 mmol/L (ref 135–145)
Total Bilirubin: 0.6 mg/dL (ref 0.3–1.2)
Total Protein: 7.6 g/dL (ref 6.5–8.1)

## 2021-04-01 LAB — ETHANOL: Alcohol, Ethyl (B): <10 mg/dL (ref <10)

## 2021-04-01 MED ORDER — LORAZEPAM 1 MG PO TABS
1.0000 mg | ORAL_TABLET | Freq: Four times a day (QID) | ORAL | Status: DC | PRN
  Administered 2021-04-01: 1 mg via ORAL
  Filled 2021-04-01: qty 1

## 2021-04-01 MED ORDER — OLANZAPINE 5 MG PO TBDP
10.0000 mg | ORAL_TABLET | ORAL | Status: AC
  Administered 2021-04-01: 10 mg via ORAL
  Filled 2021-04-01: qty 2

## 2021-04-01 MED ORDER — NICOTINE 21 MG/24HR TD PT24
21.0000 mg | MEDICATED_PATCH | Freq: Once | TRANSDERMAL | Status: DC
  Administered 2021-04-01: 21 mg via TRANSDERMAL
  Filled 2021-04-01: qty 1

## 2021-04-01 NOTE — ED Notes (Signed)
Pt is dressed out into scrubs and has been wand by security. Waiting to be moved to RM 14 for a tele sitter per charge nurse.

## 2021-04-01 NOTE — ED Provider Notes (Signed)
[facility] EMERGENCY DEPARTMENT Provider Note   CSN: [id number] Arrival date & time: 04/01/21  0115     History Chief Complaint  Patient presents with   V70.1    Ronnie Wilson is a 45 y.o. male.  Patient presents to the emergency department for evaluation of auditory hallucinations.  He reports that he is hearing voices constantly.  He states that he has been hospitalized for this, but has not been taking any medication since he left the hospital.  Reports that he feels like the voices are trying to harm him.  They are not commanding him to do anything.  He denies homicidality and suicidality.  He does state that he needs to "talk to a psychiatrist".      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  Social History   Tobacco Use   Smoking status: Every Day    Packs/day: 1.00    Types: Cigarettes   Smokeless tobacco: Never  Vaping Use   Vaping Use: Never used  Substance Use Topics   Alcohol use: No   Drug use: No    Home Medications Prior to Admission medications   Medication Sig Start Date End Date Taking? Authorizing Provider  albuterol (PROVENTIL HFA;VENTOLIN HFA) 108 (90 BASE) MCG/ACT inhaler Inhale 2 puffs into the lungs every 6 (six) hours as needed for wheezing or shortness of breath.    [provider]  HYDROcodone-acetaminophen (NORCO) 5-325 MG tablet Take 1-2 tablets by mouth every 6 (six) hours as needed. 11/07/20   Geoffery Lyons, MD  methocarbamol (ROBAXIN) 500 MG tablet Take 1 tablet (500 mg total) by mouth 2 (two) times daily. 12/24/19   Bethel Born, PA-C  nitrofurantoin, macrocrystal-monohydrate, (MACROBID) 100 MG capsule Take 1 capsule (100 mg  total) by mouth 2 (two) times daily. 12/21/19   Moshe Cipro, NP  OLANZapine (ZYPREXA) 20 MG tablet Take 1 tablet (20 mg total) by mouth at bedtime. 06/19/19   Aldean Baker, NP  phenazopyridine (PYRIDIUM) 200 MG tablet Take 1 tablet (200 mg total) by mouth 3 (three) times daily. 12/21/19   Moshe Cipro, NP  predniSONE (DELTASONE) 10 MG tablet Take 2 tablets (20 mg total) by mouth 2 (two) times daily. 11/07/20   Geoffery Lyons, MD    Allergies    Patient has no known allergies.  Review of Systems   Review of Systems  Psychiatric/Behavioral:  Positive for hallucinations.   All other systems reviewed and are negative.  Physical Exam Updated Vital Signs BP 129/88   Pulse 87   Temp 98.3 F (36.8 C)   Resp 19   Ht 5\' 6"  (1.676 m)   Wt 59 kg   SpO2 99%   BMI 20.99 kg/m   Physical Exam Vitals and nursing note reviewed.  Constitutional:      General: He is not in acute distress.    Appearance: Normal appearance. He is well-developed.  HENT:     Head: Normocephalic and atraumatic.     Right Ear: Hearing normal.     Left Ear: Hearing normal.     Nose: Nose normal.  Eyes:  Conjunctiva/sclera: Conjunctivae normal.     Pupils: Pupils are equal, round, and reactive to light.  Cardiovascular:     Rate and Rhythm: Regular rhythm.     Heart sounds: S1 normal and S2 normal. No murmur heard.   No friction rub. No gallop.  Pulmonary:     Effort: Pulmonary effort is normal. No respiratory distress.     Breath sounds: Normal breath sounds.  Chest:     Chest wall: No tenderness.  Abdominal:     General: Bowel sounds are normal.     Palpations: Abdomen is soft.     Tenderness: There is no abdominal tenderness. There is no guarding or rebound. Negative signs include Murphy's sign and McBurney's sign.     Hernia: No hernia is present.  Musculoskeletal:        General: Normal range of motion.     Cervical back: Normal range of motion and neck supple.  Skin:    General:  Skin is warm and dry.     Findings: No rash.  Neurological:     Mental Status: He is alert and oriented to person, place, and time.     GCS: GCS eye subscore is 4. GCS verbal subscore is 5. GCS motor subscore is 6.     Cranial Nerves: No cranial nerve deficit.     Sensory: No sensory deficit.     Coordination: Coordination normal.  Psychiatric:        Attention and Perception: He perceives auditory hallucinations.        Speech: Speech normal.        Thought Content: Thought content is paranoid.    ED Results / Procedures / Treatments   Labs (all labs ordered are listed, but only abnormal results are displayed) Labs Reviewed  RESP PANEL BY RT-PCR (FLU A&B, COVID) ARPGX2  CBC WITH DIFFERENTIAL/PLATELET  COMPREHENSIVE METABOLIC PANEL  ETHANOL  RAPID URINE DRUG SCREEN, HOSP PERFORMED    EKG EKG Interpretation  Date/Time:  Wednesday April 01 2021 01:56:11 EST Ventricular Rate:  88 PR Interval:  138 QRS Duration: 78 QT Interval:  346 QTC Calculation: 418 R Axis:   64 Text Interpretation: Normal sinus rhythm Septal infarct , age undetermined Abnormal ECG Confirmed by Gilda Crease [phone]) on 04/01/2021 2:31:56 AM  Radiology No results found.  Procedures Procedures   Medications Ordered in ED Medications  nicotine (NICODERM CQ - dosed in mg/24 hours) patch 21 mg (21 mg Transdermal Patch Applied 04/01/21 0232)  LORazepam (ATIVAN) tablet 1 mg (1 mg Oral Given 04/01/21 0232)    ED Course  I have reviewed the triage vital signs and the nursing notes.  Pertinent labs & imaging results that were available during my care of the patient were reviewed by me and considered in my medical decision making (see chart for details).    MDM Rules/Calculators/A&P                           Patient presents with auditory hallucinations.  He has not homicidal or suicidal.  I do not feel he requires involuntary commitment at this time, but he is asking to talk to behavioral  health.  Will perform behavioral health consult.  Final Clinical Impression(s) / ED Diagnoses Final diagnoses:  Auditory hallucinations    Rx / DC Orders ED Discharge Orders     None        Gracyn Allor, Canary Brim, MD 04/01/21 [phone]

## 2021-04-01 NOTE — ED Notes (Signed)
Pt resting all day no complaints , ate breakfast and lunch awaiting tts

## 2021-04-01 NOTE — ED Notes (Signed)
Took fork and knife off of tray before giving to patient

## 2021-04-01 NOTE — Discharge Instructions (Addendum)
Follow up with [facility] this week

## 2021-04-01 NOTE — ED Notes (Signed)
Telesitter at bedside.  

## 2021-04-01 NOTE — ED Triage Notes (Signed)
Pt brought in by [employer] voluntarily for auditory hallucinations. Pt states the voices are threatening to kill him. Pt states this has been going on for a while.

## 2021-04-03 ENCOUNTER — Emergency Department (HOSPITAL_COMMUNITY)
Admission: EM | Admit: 2021-04-03 | Discharge: 2021-04-06 | Disposition: A | Discharge status: Home / Self Care | Payer: Self-pay | Attending: Emergency Medicine | Admitting: Emergency Medicine

## 2021-04-03 ENCOUNTER — Encounter (HOSPITAL_COMMUNITY): Payer: Self-pay | Admitting: *Deleted

## 2021-04-03 DIAGNOSIS — Y900 Blood alcohol level of less than 20 mg/100 ml: Secondary | ICD-10-CM | POA: Insufficient documentation

## 2021-04-03 DIAGNOSIS — Z20822 Contact with and (suspected) exposure to covid-19: Secondary | ICD-10-CM | POA: Insufficient documentation

## 2021-04-03 DIAGNOSIS — F29 Unspecified psychosis not due to a substance or known physiological condition: Secondary | ICD-10-CM | POA: Insufficient documentation

## 2021-04-03 DIAGNOSIS — F17210 Nicotine dependence, cigarettes, uncomplicated: Secondary | ICD-10-CM | POA: Insufficient documentation

## 2021-04-03 DIAGNOSIS — R000 Tachycardia, unspecified: Secondary | ICD-10-CM | POA: Insufficient documentation

## 2021-04-03 DIAGNOSIS — F15959 Other stimulant use, unspecified with stimulant-induced psychotic disorder, unspecified: Secondary | ICD-10-CM | POA: Insufficient documentation

## 2021-04-03 DIAGNOSIS — Z79899 Other long term (current) drug therapy: Secondary | ICD-10-CM | POA: Insufficient documentation

## 2021-04-03 LAB — CBC
HCT: 33.1 % — ABNORMAL LOW (ref 39.0–52.0)
Hemoglobin: 11.4 g/dL — ABNORMAL LOW (ref 13.0–17.0)
MCH: 31.6 pg (ref 26.0–34.0)
MCHC: 34.4 g/dL (ref 30.0–36.0)
MCV: 91.7 fL (ref 80.0–100.0)
Platelets: 397 10*3/uL (ref 150–400)
RBC: 3.61 MIL/uL — ABNORMAL LOW (ref 4.22–5.81)
RDW: 13.4 % (ref 11.5–15.5)
WBC: 8.3 10*3/uL (ref 4.0–10.5)
nRBC: 0 % (ref 0.0–0.2)

## 2021-04-03 LAB — RESP PANEL BY RT-PCR (FLU A&B, COVID) ARPGX2
Influenza A by PCR: NEGATIVE
Influenza B by PCR: NEGATIVE
SARS Coronavirus 2 by RT PCR: NEGATIVE

## 2021-04-03 LAB — RAPID URINE DRUG SCREEN, HOSP PERFORMED
Amphetamines: NOT DETECTED
Barbiturates: NOT DETECTED
Benzodiazepines: NOT DETECTED
Cocaine: NOT DETECTED
Opiates: NOT DETECTED
Tetrahydrocannabinol: NOT DETECTED

## 2021-04-03 LAB — COMPREHENSIVE METABOLIC PANEL
ALT: 11 U/L (ref 0–44)
AST: 15 U/L (ref 15–41)
Albumin: 3.9 g/dL (ref 3.5–5.0)
Alkaline Phosphatase: 63 U/L (ref 38–126)
Anion gap: 8 (ref 5–15)
BUN: 9 mg/dL (ref 6–20)
CO2: 23 mmol/L (ref 22–32)
Calcium: 8.7 mg/dL — ABNORMAL LOW (ref 8.9–10.3)
Chloride: 104 mmol/L (ref 98–111)
Creatinine, Ser: 0.87 mg/dL (ref 0.61–1.24)
GFR, Estimated: >60 mL/min (ref >60)
Glucose, Bld: 97 mg/dL (ref 70–99)
Potassium: 3.5 mmol/L (ref 3.5–5.1)
Sodium: 135 mmol/L (ref 135–145)
Total Bilirubin: 0.6 mg/dL (ref 0.3–1.2)
Total Protein: 6.9 g/dL (ref 6.5–8.1)

## 2021-04-03 LAB — ETHANOL: Alcohol, Ethyl (B): <10 mg/dL (ref <10)

## 2021-04-03 MED ORDER — OLANZAPINE 5 MG PO TBDP
10.0000 mg | ORAL_TABLET | Freq: Three times a day (TID) | ORAL | Status: DC | PRN
  Filled 2021-04-03: qty 2

## 2021-04-03 MED ORDER — ZIPRASIDONE MESYLATE 20 MG IM SOLR: 20.0000 mg | INTRAMUSCULAR | Status: DC | PRN

## 2021-04-03 NOTE — ED Notes (Signed)
Pt belongings placed in locker.  2 bags.

## 2021-04-03 NOTE — ED Notes (Signed)
U/a sent
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
## 2021-04-03 NOTE — ED Notes (Signed)
Meal given to pt.

## 2021-04-03 NOTE — ED Triage Notes (Signed)
Patient brought in by [employer]. Patient has been making call2 all night and  saying someone is after him

## 2021-04-04 DIAGNOSIS — F29 Unspecified psychosis not due to a substance or known physiological condition: Secondary | ICD-10-CM | POA: Insufficient documentation

## 2021-04-04 LAB — LIPID PANEL
Cholesterol: 133 mg/dL (ref 0–200)
HDL: 46 mg/dL (ref >40)
LDL Cholesterol: 72 mg/dL (ref 0–99)
Total CHOL/HDL Ratio: 2.9 RATIO
Triglycerides: 74 mg/dL (ref <150)
VLDL: 15 mg/dL (ref 0–40)

## 2021-04-04 MED ORDER — OLANZAPINE 5 MG PO TBDP
10.0000 mg | ORAL_TABLET | Freq: Every day | ORAL | Status: DC
  Administered 2021-04-04 – 2021-04-05 (×2): 10 mg via ORAL
  Filled 2021-04-04 (×2): qty 2

## 2021-04-04 NOTE — Progress Notes (Signed)
Per Ronnie Wilson, patient meets criteria for inpatient treatment. There are no available or appropriate beds at [facility] today, per Ronnie Wilson. CSW faxed referrals to the following facilities for review:  [facility]  Pending - No Request Sent N/A [address] [place] [address] [phone] --  [facility]  Pending - No Request Sent N/A [address], [place] [address] [phone] --  [facility]  Pending - No Request Sent N/A [address] [place] [address] [phone] --  [facility]  Pending - No Request Sent N/A [address] [place] [address] [phone] --  [facility]  Pending - No Request Sent N/A [address] [place] [address] [phone] --  [facility]  Pending - No Request Sent N/A [address] [place] [address] [phone] --  [facility]  Pending - No Request Sent N/A [address] [place], [place] [address] [phone] --  [facility]  Pending - No Request Sent N/A [address] [place] [address] [phone] --  [facility]  Pending - No Request Sent N/A [address], [place] [address] [phone] --  [facility]  Pending - No Request Sent N/A [address] [place] [address] [phone] --  [facility]  Pending - No Request Sent N/A [address] [place] [address] [phone] --  [facility]  Pending - No Request Sent N/A [address], [place] [address] [phone] --  [facility]  Pending - No Request Sent N/A [address] [place] [address] [phone] --  [facility]  Pending - No Request Sent N/A [address] [place] [address] [phone] --  [facility]  Pending - No Request Sent N/A [address] Karolee Ohs., [place] [address] [phone] --  [facility]  Pending - No Request Sent N/A [address], [place] [address] [phone] --  [facility]  Pending - No Request Sent N/A [address], [place] [address] [phone] --  [facility]  Pending - No Request Sent N/A [address] [place] [address] [phone] --    TTS will continue to seek bed placement.  Ronnie Wilson, MSW, LCSW-A, LCAS-A Phone: [phone] Disposition/TOC

## 2021-04-04 NOTE — ED Notes (Signed)
Report given to [facility] telesitter. Telesitter states that camera monitoring is now in place.

## 2021-04-04 NOTE — ED Notes (Signed)
Got pt cleaning supplies to bathe,wash hair and brush teeth  and a clean set of scrubbs took pt to the shower

## 2021-04-04 NOTE — ED Notes (Signed)
Directed pt to the bathroom. Got him a soda and crackers

## 2021-04-04 NOTE — ED Notes (Signed)
Pt wants to use phone will let RN know

## 2021-04-04 NOTE — ED Notes (Signed)
Gave pt graham crackers and OJ

## 2021-04-04 NOTE — ED Notes (Signed)
Pt ambulated to shower without difficulties. New scrubs, toothpaste, toothbrush, hairbrush, soap, and towels given. Pt back to room in bed resting comfortably. Respirations even and unlabored.
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
## 2021-04-04 NOTE — ED Notes (Signed)
Private sitter monitor taken to room

## 2021-04-05 MED ORDER — NALOXONE HCL 0.4 MG/ML IJ SOLN: 0.2000 mg | Freq: Once | INTRAMUSCULAR | Status: AC

## 2021-04-05 MED ORDER — NALOXONE HCL 0.4 MG/ML IJ SOLN
INTRAMUSCULAR | Status: AC
  Administered 2021-04-05: 0.2 mg via INTRAVENOUS
  Filled 2021-04-05: qty 1

## 2021-04-05 MED ORDER — NALOXONE HCL 0.4 MG/ML IJ SOLN: 0.4000 mg | Freq: Once | INTRAMUSCULAR | Status: DC

## 2021-04-05 MED ORDER — NICOTINE 21 MG/24HR TD PT24
21.0000 mg | MEDICATED_PATCH | Freq: Every day | TRANSDERMAL | Status: DC
  Administered 2021-04-05: 21 mg via TRANSDERMAL
  Filled 2021-04-05: qty 1

## 2021-04-05 NOTE — ED Notes (Signed)
EDP made aware and is working on [employer]

## 2021-04-05 NOTE — ED Notes (Signed)
Pt ambulated tot BR without difficultly

## 2021-04-05 NOTE — ED Notes (Signed)
Pt left the dept. Security called and looking for pt

## 2021-04-05 NOTE — ED Notes (Signed)
[employer] escorted pt back to his room

## 2021-04-05 NOTE — ED Notes (Signed)
Pt with upper dentures wrapped in a paper towel, dentures unwrapped and placed in denture cup in the room.  [employer] officer assessed paper towel for any possible drugs tucked in the folds of paper towel and none was found.

## 2021-04-05 NOTE — ED Notes (Signed)
Security unable to locate pt, IVC papers done and [employer] called to locate pt .

## 2021-04-05 NOTE — ED Notes (Signed)
Patient given warm blanket and [place] at this time

## 2021-04-05 NOTE — ED Notes (Signed)
Pt wanded by security again .  Pt's jacket and croc shoes locked up in the dept.

## 2021-04-05 NOTE — ED Notes (Signed)
Narcan 0.2mg  given per EDP's orders.

## 2021-04-05 NOTE — ED Notes (Signed)
Pt ambulated to BR. Tele sitter informed that pt went to BR.

## 2021-04-05 NOTE — ED Notes (Signed)
Pt denies any drug use but admits to smoking a cigarette while gone.

## 2021-04-05 NOTE — ED Notes (Signed)
Pt not located in the BR or pt 's room.

## 2021-04-05 NOTE — ED Notes (Signed)
Sitter at bedside.

## 2021-04-05 NOTE — ED Notes (Signed)
Pt  alert and eating peanut butter crackers while watching TV. Sitter remains at bedside.

## 2021-04-05 NOTE — ED Notes (Addendum)
RA sats in the upper 80's, O2 [place] applied to pt at 2 L/M and sats increased.  Pt is drowsy.  EDP in room to assess pt. Instructed to hold narcan at this time. Will continue to monitor.

## 2021-04-05 NOTE — ED Notes (Signed)
[employer] spoke with pt and pt admitting to [employer] officer that he snorted some heroin

## 2021-04-05 NOTE — ED Notes (Signed)
Pt more alert since narcan given.

## 2021-04-05 NOTE — ED Notes (Signed)
Nicotine patch applied. Pt requesting something for his anxiety and claustrophobia.

## 2021-04-06 NOTE — ED Notes (Signed)
Breakfast tray given. °

## 2021-04-06 NOTE — Consult Note (Signed)
Telepsych Consultation   Reason for Consult:  psychiatric evaluation  Referring Physician:  Dr. Rhunette Croft, EDP  Location of Patient:  [facility] Location of Provider: [facility]  Patient Identification: Ronnie Wilson MRN:  [id number] Principal Diagnosis: Amphetamine-induced psychotic disorder [facility] Diagnosis:  Principal Problem:   Amphetamine-induced psychotic disorder (HCC)   Total Time spent with patient: 20 minutes  Subjective:   Ronnie Wilson is a 45 y.o. male patient admitted with per admit note:   Patient presents to the emergency department for evaluation of auditory hallucinations.  He reports that he is hearing voices constantly.  He states that he has been hospitalized for this, but has not been taking any medication since he left the hospital.  Reports that he feels like the voices are trying to harm him.  They are not commanding him to do anything.  He denies homicidality and suicidality.  He does state that he needs to "talk to a psychiatrist".  .  HPI:  Ronnie Wilson is 45 year old male seen by this provider via tele psych at [facility]. Patient reports he did some meth and then his mind starting playing games with him and messing with me". "I feel good now and will never do meth again". When asked if he uses meth on a regular basis he reports, "someone gave it to me and I took it, I will never do that again".  Denies previous mental health diagnosis, denies need for substance use treatment.   Alert and oriented x 4, sitting calmly in room with good eye contact and cooperating with interview.   Denies suicidal ideation, no intent, no plan, denies homicidal ideation, denies auditory and visual hallucinations. Denies feeling paranoid or delusional thinking. Does not appear to be responding to internal or external stimuli. Able to contract for safety. Denies previous suicide attempts. Does not take medications for his mental health.   Denies seeing a psychiatrist or  therapist, however, he has contacted [facility] since being in the ED and knows they have walk in hours and he plans to go there tomorrow. His Dad is going to help him to get to that appointment. He wants to discuss medications for sleep with [facility] provider.  Lives with Dad, and considers him his support. Sleeps 8-10 hours per night while in ED, but not well at home,  and eats well.   Endorses meth use and occasional alcohol use, will not use meth again. Eager to go home today.   Past Psychiatric History: none  Risk to Self:  no Risk to Others:  no Prior Inpatient Therapy:  no Prior Outpatient Therapy:  no  Past Medical History:  Past Medical History:  Diagnosis Date   Chronic back pain     Past Surgical History:  Procedure Laterality Date   arm surgery  left     tendon and laceration repair   SHOULDER SURGERY     Family History: No family history on file. Family Psychiatric  History: unknown Social History:  Social History   Substance and Sexual Activity  Alcohol Use No     Social History   Substance and Sexual Activity  Drug Use No    Social History   Socioeconomic History   Marital status: Divorced    Spouse name: Not on file   Number of children: 2   Years of education: Not on file   Highest education level: Not on file  Occupational History   Not on file  Tobacco Use  Smoking status: Every Day    Packs/day: 1.00    Types: Cigarettes   Smokeless tobacco: Never  Vaping Use   Vaping Use: Never used  Substance and Sexual Activity   Alcohol use: No   Drug use: No   Sexual activity: Not on file  Other Topics Concern   Not on file  Social History Narrative   Not on file   Social Determinants of Health   Financial Resource Strain: Not on file  Food Insecurity: Not on file  Transportation Needs: Not on file  Physical Activity: Not on file  Stress: Not on file  Social Connections: Not on file   Additional Social History:    Allergies:  No Known  Allergies  Labs:  Results for orders placed or performed during the hospital encounter of 04/03/21 (from the past 48 hour(s))  Lipid panel     Status: None   Collection Time: 04/04/21 11:03 AM  Result Value Ref Range   Cholesterol 133 0 - 200 mg/dL   Triglycerides 74 <782 mg/dL   HDL 46 >95 mg/dL   Total CHOL/HDL Ratio 2.9 RATIO   VLDL 15 0 - 40 mg/dL   LDL Cholesterol 72 0 - 99 mg/dL    Comment:        Total Cholesterol/HDL:CHD Risk Coronary Heart Disease Risk Table                     Men   Women  1/2 Average Risk   3.4   3.3  Average Risk       5.0   4.4  2 X Average Risk   9.6   7.1  3 X Average Risk  23.4   11.0        Use the calculated Patient Ratio above and the CHD Risk Table to determine the patient's CHD Risk.        ATP III CLASSIFICATION (LDL):  <100     mg/dL   Optimal  621-308  mg/dL   Near or Above                    Optimal  130-159  mg/dL   Borderline  657-846  mg/dL   High  >962     mg/dL   Very High Performed at [facility], [address], [place], [place] [address]     Medications:  Current Facility-Administered Medications  Medication Dose Route Frequency Provider Last Rate Last Admin   naloxone [facility]) injection 0.4 mg  0.4 mg Intramuscular Once Hong, Eustace Moore, MD       nicotine (NICODERM CQ - dosed in mg/24 hours) patch 21 mg  21 mg Transdermal Daily Hong, Eustace Moore, MD   21 mg at 04/05/21 1447   OLANZapine zydis (ZYPREXA) disintegrating tablet 10 mg  10 mg Oral Q8H PRN Leevy-Johnson, Brooke A, NP       And   ziprasidone (GEODON) injection 20 mg  20 mg Intramuscular PRN Leevy-Johnson, Brooke A, NP       OLANZapine zydis (ZYPREXA) disintegrating tablet 10 mg  10 mg Oral Daily Leevy-Johnson, Brooke A, NP   10 mg at 04/05/21 1010   Current Outpatient Medications  Medication Sig Dispense Refill   albuterol (PROVENTIL HFA;VENTOLIN HFA) 108 (90 BASE) MCG/ACT inhaler Inhale 2 puffs into the lungs every 6 (six) hours as needed for wheezing or  shortness of breath. (Patient not taking: No sig reported)     HYDROcodone-acetaminophen (NORCO) 5-325 MG  tablet Take 1-2 tablets by mouth every 6 (six) hours as needed. (Patient not taking: No sig reported) 12 tablet 0   methocarbamol (ROBAXIN) 500 MG tablet Take 1 tablet (500 mg total) by mouth 2 (two) times daily. (Patient not taking: No sig reported) 20 tablet 0   nitrofurantoin, macrocrystal-monohydrate, (MACROBID) 100 MG capsule Take 1 capsule (100 mg total) by mouth 2 (two) times daily. (Patient not taking: No sig reported) 10 capsule 0   OLANZapine (ZYPREXA) 20 MG tablet Take 1 tablet (20 mg total) by mouth at bedtime. (Patient not taking: No sig reported) 30 tablet 0   phenazopyridine (PYRIDIUM) 200 MG tablet Take 1 tablet (200 mg total) by mouth 3 (three) times daily. (Patient not taking: No sig reported) 6 tablet 0   predniSONE (DELTASONE) 10 MG tablet Take 2 tablets (20 mg total) by mouth 2 (two) times daily. (Patient not taking: No sig reported) 20 tablet 0    Musculoskeletal: Strength & Muscle Tone: within normal limits Gait & Station: normal Patient leans: N/A          Psychiatric Specialty Exam:  Presentation  General Appearance: Appropriate for Environment  Eye Contact:Good  Speech:Clear and Coherent; Normal Rate  Speech Volume:Normal  Handedness:No data recorded  Mood and Affect  Mood:Euthymic  Affect:Appropriate   Thought Process  Thought Processes:Coherent; Goal Directed  Descriptions of Associations:Intact  Orientation:Full (Time, Place and Person)  Thought Content:Logical  History of Schizophrenia/Schizoaffective disorder:No  Duration of Psychotic Symptoms:N/A  Hallucinations:Hallucinations: None Ideas of Reference:None  Suicidal Thoughts:Suicidal Thoughts: No Homicidal Thoughts:Homicidal Thoughts: No  Sensorium  Memory:Immediate Good; Recent Good; Remote Good  Judgment:Good  Insight:Good   Executive Functions   Concentration:Good  Attention Span:Good  Recall:Good  Fund of Knowledge:Good  Language:Good   Psychomotor Activity  Psychomotor Activity:Psychomotor Activity: Normal  Assets  Assets:Communication Skills; Desire for Improvement; Financial Resources/Insurance; Housing; Leisure Time; Physical Health; Resilience; Social Support; Transportation   Sleep  Sleep:Sleep: Good Number of Hours of Sleep: 8   Physical Exam: Physical Exam Vitals reviewed.  Constitutional:      Appearance: Normal appearance.  Cardiovascular:     Rate and Rhythm: Normal rate.  Pulmonary:     Effort: Pulmonary effort is normal.  Neurological:     Mental Status: He is alert and oriented to person, place, and time.  Psychiatric:        Attention and Perception: Attention normal.        Mood and Affect: Mood normal.        Speech: Speech normal.        Behavior: Behavior normal. Behavior is cooperative.        Thought Content: Thought content is not paranoid or delusional. Thought content does not include homicidal or suicidal ideation. Thought content does not include homicidal or suicidal plan.        Cognition and Memory: Cognition normal.   Review of Systems  Constitutional:  Negative for chills and fever.  Respiratory:  Negative for shortness of breath.   Cardiovascular:  Negative for chest pain.  Gastrointestinal:  Negative for abdominal pain.  Neurological:  Negative for headaches.  Psychiatric/Behavioral:  Positive for substance abuse. Negative for depression, hallucinations and suicidal ideas. The patient is not nervous/anxious and does not have insomnia.   Blood pressure 116/87, pulse 62, temperature 97.6 F (36.4 C), temperature source Oral, resp. rate 13, height 5\' 5"  (1.651 m), weight 61.2 kg, SpO2 99 %. Body mass index is 22.47 kg/m.  Treatment Plan Summary: Plan Psychiatrically  cleared with outpatient resources. [facility] has already contacted [facility] and understands he can walk-in  during hours Monday-Friday. Plans to walk in tomorrow. Denies needing substance use treatment. Safety planning reviewed in detail.   Disposition: No evidence of imminent risk to self or others at present.   Patient does not meet criteria for psychiatric inpatient admission. Supportive therapy provided about ongoing stressors. Discussed crisis plan, support from social network, calling 911, coming to the Emergency Department, and calling Suicide Hotline.  This service was provided via telemedicine using a 2-way, interactive audio and video technology.  Names of all persons participating in this telemedicine service and their role in this encounter. Name: Wendall Ehrenfeld Role: pateint  Name: Dorena Bodo Role: NP  Name: Nelly Rout Role: MD/psychiatrist    Novella Olive, NP 04/06/2021 9:54 AM

## 2021-04-06 NOTE — ED Provider Notes (Signed)
Emergency Medicine Observation Re-evaluation Note  Ronnie Wilson is a 45 y.o. male, seen on rounds today.  Pt initially presented to the ED for complaints of V70.1 and Addiction Problem (AMS as a result of meth use) Currently, the patient is resting comfortably.  Physical Exam  BP 116/87   Pulse 62   Temp 97.6 F (36.4 C) (Oral)   Resp 13   Ht 5\' 5"  (1.651 m)   Wt 61.2 kg   SpO2 99%   BMI 22.47 kg/m  Physical Exam General: No acute distress Cardiac: Regular rate Lungs: No respiratory distress Psych: Cooperative, calm  ED Course / MDM  EKG:   I have reviewed the labs performed to date as well as medications administered while in observation.  Recent changes in the last 24 hours include no new changes.  Patient was assessed by psychiatry team this morning and cleared. His IVC was rescinded. Patient feels comfortable with outpatient follow-up, reports that his father will take him there.  Plan  Current plan is for to be discharged after his IVC is rescinded.  Kayne Yuhas Kraeger is not under involuntary commitment.     Veverly Fells, MD 04/06/21 1026

## 2021-04-06 NOTE — ED Notes (Signed)
Drink and snack given to pt  

## 2021-04-06 NOTE — ED Notes (Signed)
3 bags given to pt from locker

## 2021-04-06 NOTE — ED Notes (Signed)
Pt is psych cleared.  

## 2021-04-06 NOTE — ED Notes (Signed)
TTS at this time. 

## 2021-04-06 NOTE — Discharge Instructions (Signed)
Patient has been recommended to establish and engage in outpatient therapeutic services to address SA issues in the form of medication management and therapeutic services. Patient should select one out of the three of the recommended referrals to secure those services with.

## 2021-04-18 ENCOUNTER — Emergency Department (HOSPITAL_COMMUNITY)
Admission: EM | Admit: 2021-04-18 | Discharge: 2021-04-18 | Disposition: A | Discharge status: Home / Self Care | Payer: Self-pay | Attending: Emergency Medicine | Admitting: Emergency Medicine

## 2021-04-18 ENCOUNTER — Encounter (HOSPITAL_COMMUNITY): Payer: Self-pay | Admitting: Emergency Medicine

## 2021-04-18 ENCOUNTER — Other Ambulatory Visit: Payer: Self-pay

## 2021-04-18 DIAGNOSIS — Z79899 Other long term (current) drug therapy: Secondary | ICD-10-CM | POA: Insufficient documentation

## 2021-04-18 DIAGNOSIS — F15951 Other stimulant use, unspecified with stimulant-induced psychotic disorder with hallucinations: Secondary | ICD-10-CM

## 2021-04-18 DIAGNOSIS — F1510 Other stimulant abuse, uncomplicated: Secondary | ICD-10-CM | POA: Insufficient documentation

## 2021-04-18 DIAGNOSIS — F1520 Other stimulant dependence, uncomplicated: Secondary | ICD-10-CM | POA: Diagnosis present

## 2021-04-18 DIAGNOSIS — F15959 Other stimulant use, unspecified with stimulant-induced psychotic disorder, unspecified: Secondary | ICD-10-CM | POA: Diagnosis present

## 2021-04-18 DIAGNOSIS — E876 Hypokalemia: Secondary | ICD-10-CM | POA: Insufficient documentation

## 2021-04-18 DIAGNOSIS — F17210 Nicotine dependence, cigarettes, uncomplicated: Secondary | ICD-10-CM | POA: Insufficient documentation

## 2021-04-18 DIAGNOSIS — Y900 Blood alcohol level of less than 20 mg/100 ml: Secondary | ICD-10-CM | POA: Insufficient documentation

## 2021-04-18 DIAGNOSIS — E871 Hypo-osmolality and hyponatremia: Secondary | ICD-10-CM | POA: Insufficient documentation

## 2021-04-18 LAB — CBC WITH DIFFERENTIAL/PLATELET
Abs Immature Granulocytes: 0.03 10*3/uL (ref 0.00–0.07)
Basophils Absolute: 0.1 10*3/uL (ref 0.0–0.1)
Basophils Relative: 1 %
Eosinophils Absolute: 0.1 10*3/uL (ref 0.0–0.5)
Eosinophils Relative: 1 %
HCT: 35.3 % — ABNORMAL LOW (ref 39.0–52.0)
Hemoglobin: 12.3 g/dL — ABNORMAL LOW (ref 13.0–17.0)
Immature Granulocytes: 0 %
Lymphocytes Relative: 22 %
Lymphs Abs: 2.4 10*3/uL (ref 0.7–4.0)
MCH: 31.5 pg (ref 26.0–34.0)
MCHC: 34.8 g/dL (ref 30.0–36.0)
MCV: 90.3 fL (ref 80.0–100.0)
Monocytes Absolute: 1 10*3/uL (ref 0.1–1.0)
Monocytes Relative: 10 %
Neutro Abs: 7.1 10*3/uL (ref 1.7–7.7)
Neutrophils Relative %: 66 %
Platelets: 419 10*3/uL — ABNORMAL HIGH (ref 150–400)
RBC: 3.91 MIL/uL — ABNORMAL LOW (ref 4.22–5.81)
RDW: 13.1 % (ref 11.5–15.5)
WBC: 10.7 10*3/uL — ABNORMAL HIGH (ref 4.0–10.5)
nRBC: 0 % (ref 0.0–0.2)

## 2021-04-18 LAB — COMPREHENSIVE METABOLIC PANEL
ALT: 15 U/L (ref 0–44)
AST: 24 U/L (ref 15–41)
Albumin: 4 g/dL (ref 3.5–5.0)
Alkaline Phosphatase: 67 U/L (ref 38–126)
Anion gap: 10 (ref 5–15)
BUN: 10 mg/dL (ref 6–20)
CO2: 21 mmol/L — ABNORMAL LOW (ref 22–32)
Calcium: 8.8 mg/dL — ABNORMAL LOW (ref 8.9–10.3)
Chloride: 102 mmol/L (ref 98–111)
Creatinine, Ser: 0.83 mg/dL (ref 0.61–1.24)
GFR, Estimated: >60 mL/min (ref >60)
Glucose, Bld: 107 mg/dL — ABNORMAL HIGH (ref 70–99)
Potassium: 3.3 mmol/L — ABNORMAL LOW (ref 3.5–5.1)
Sodium: 133 mmol/L — ABNORMAL LOW (ref 135–145)
Total Bilirubin: 1.1 mg/dL (ref 0.3–1.2)
Total Protein: 7.2 g/dL (ref 6.5–8.1)

## 2021-04-18 LAB — RAPID URINE DRUG SCREEN, HOSP PERFORMED
Amphetamines: POSITIVE — AB
Barbiturates: NOT DETECTED
Benzodiazepines: NOT DETECTED
Cocaine: NOT DETECTED
Opiates: NOT DETECTED
Tetrahydrocannabinol: NOT DETECTED

## 2021-04-18 LAB — ACETAMINOPHEN LEVEL: Acetaminophen (Tylenol), Serum: <10 ug/mL — ABNORMAL LOW (ref 10–30)

## 2021-04-18 LAB — SALICYLATE LEVEL: Salicylate Lvl: <7 mg/dL — ABNORMAL LOW (ref 7.0–30.0)

## 2021-04-18 LAB — ETHANOL: Alcohol, Ethyl (B): <10 mg/dL (ref <10)

## 2021-04-18 MED ORDER — ALBUTEROL SULFATE HFA 108 (90 BASE) MCG/ACT IN AERS: 2.0000 | INHALATION_SPRAY | Freq: Four times a day (QID) | RESPIRATORY_TRACT | Status: DC | PRN

## 2021-04-18 MED ORDER — OLANZAPINE 5 MG PO TABS: 20.0000 mg | ORAL_TABLET | Freq: Every day | ORAL | Status: DC

## 2021-04-18 MED ORDER — MELATONIN 3 MG PO TABS
9.0000 mg | ORAL_TABLET | Freq: Every day | ORAL | Status: DC
  Administered 2021-04-18: 9 mg via ORAL

## 2021-04-18 MED ORDER — MELATONIN 5 MG PO TABS
10.0000 mg | ORAL_TABLET | Freq: Once | ORAL | Status: DC
  Filled 2021-04-18: qty 2

## 2021-04-18 MED ORDER — LORAZEPAM 1 MG PO TABS
1.0000 mg | ORAL_TABLET | Freq: Once | ORAL | Status: AC
  Administered 2021-04-18: 1 mg via ORAL
  Filled 2021-04-18: qty 1

## 2021-04-18 MED ORDER — POTASSIUM CHLORIDE CRYS ER 20 MEQ PO TBCR: 40.0000 meq | EXTENDED_RELEASE_TABLET | Freq: Once | ORAL | Status: DC

## 2021-04-18 MED ORDER — DIPHENHYDRAMINE HCL 25 MG PO CAPS
50.0000 mg | ORAL_CAPSULE | Freq: Once | ORAL | Status: AC
  Administered 2021-04-18: 50 mg via ORAL
  Filled 2021-04-18: qty 2

## 2021-04-18 NOTE — ED Notes (Signed)
Per TTS, patient has been psyched cleared and IVC to be rescinded.

## 2021-04-18 NOTE — ED Notes (Addendum)
Patient has been redirected back to his bed by security three times thus far this shift. States that he wants to go home. MD is aware of behavior and that patient wants to speak with him.

## 2021-04-18 NOTE — ED Provider Notes (Signed)
[facility] EMERGENCY DEPARTMENT Provider Note   CSN: [id number] Arrival date & time: 04/18/21  0013     History Chief Complaint  Patient presents with   Medical Clearance    Ronnie Wilson is a 45 y.o. male.  45 year old male with past medical history of amphetamine induced psychosis who presents to the emerged from today with similar presentation.  Patient been involuntarily committed by his family.  On IVC paperwork it states that he has been using drugs and has had significant episodes recently of suicidal and homicidal threats.  Decreased sleep.  Hallucinating.  After speaking with [facility] [employer] it sounds like they are verifying that this is the case that the patient has had 3 to 4 weeks of significant worsening escalating behavior likely related to methamphetamine abuse.  A better deal with him multiple times.  The patient agrees to this.  He denies any other alcohol or drugs recently.  Denies any suicidal homicidal ideation at this time.       Past Medical History:  Diagnosis Date   Chronic back pain     Patient Active Problem List   Diagnosis Date Noted   Psychosis (HCC) 04/04/2021   Amphetamine-induced psychotic disorder (HCC) 06/18/2019    Past Surgical History:  Procedure Laterality Date   arm surgery  left     tendon and laceration repair   SHOULDER SURGERY         History reviewed. No pertinent family history.  Social History   Tobacco Use   Smoking status: Every Day    Packs/day: 1.00    Types: Cigarettes   Smokeless tobacco: Never  Vaping Use   Vaping Use: Never used  Substance Use Topics   Alcohol use: No   Drug use: No    Home Medications Prior to Admission medications   Medication Sig Start Date End Date Taking? Authorizing Provider  albuterol (PROVENTIL HFA;VENTOLIN HFA) 108 (90 BASE) MCG/ACT inhaler Inhale 2 puffs into the lungs every 6 (six) hours as needed for wheezing or shortness of breath. Patient not taking: No  sig reported    [provider]  HYDROcodone-acetaminophen (NORCO) 5-325 MG tablet Take 1-2 tablets by mouth every 6 (six) hours as needed. Patient not taking: No sig reported 11/07/20   Geoffery Lyons, MD  methocarbamol (ROBAXIN) 500 MG tablet Take 1 tablet (500 mg total) by mouth 2 (two) times daily. Patient not taking: No sig reported 12/24/19   Bethel Born, PA-C  nitrofurantoin, macrocrystal-monohydrate, (MACROBID) 100 MG capsule Take 1 capsule (100 mg total) by mouth 2 (two) times daily. Patient not taking: No sig reported 12/21/19   Moshe Cipro, NP  OLANZapine (ZYPREXA) 20 MG tablet Take 1 tablet (20 mg total) by mouth at bedtime. Patient not taking: No sig reported 06/19/19   Aldean Baker, NP  phenazopyridine (PYRIDIUM) 200 MG tablet Take 1 tablet (200 mg total) by mouth 3 (three) times daily. Patient not taking: No sig reported 12/21/19   Moshe Cipro, NP  predniSONE (DELTASONE) 10 MG tablet Take 2 tablets (20 mg total) by mouth 2 (two) times daily. Patient not taking: No sig reported 11/07/20   Geoffery Lyons, MD    Allergies    Patient has no known allergies.  Review of Systems   Review of Systems  All other systems reviewed and are negative.  Physical Exam Updated Vital Signs BP (!) 149/89   Pulse 99   Temp 98.1 F (36.7 C) (Oral)   Resp 18  Ht 5\' 5"  (1.651 m)   Wt 61 kg   SpO2 100%   BMI 22.38 kg/m   Physical Exam Vitals and nursing note reviewed.  Constitutional:      Appearance: He is well-developed.  HENT:     Head: Normocephalic and atraumatic.     Mouth/Throat:     Mouth: Mucous membranes are moist.     Pharynx: Oropharynx is clear.  Eyes:     Pupils: Pupils are equal, round, and reactive to light.  Cardiovascular:     Rate and Rhythm: Normal rate.  Pulmonary:     Effort: Pulmonary effort is normal. No respiratory distress.  Abdominal:     General: Abdomen is flat. There is no distension.  Musculoskeletal:        General:  Normal range of motion.     Cervical back: Normal range of motion.  Skin:    General: Skin is warm and dry.     Coloration: Skin is not jaundiced or pale.  Neurological:     General: No focal deficit present.     Mental Status: He is alert.    ED Results / Procedures / Treatments   Labs (all labs ordered are listed, but only abnormal results are displayed) Labs Reviewed  COMPREHENSIVE METABOLIC PANEL - Abnormal; Notable for the following components:      Result Value   Sodium 133 (*)    Potassium 3.3 (*)    CO2 21 (*)    Glucose, Bld 107 (*)    Calcium 8.8 (*)    All other components within normal limits  RAPID URINE DRUG SCREEN, HOSP PERFORMED - Abnormal; Notable for the following components:   Amphetamines POSITIVE (*)    All other components within normal limits  CBC WITH DIFFERENTIAL/PLATELET - Abnormal; Notable for the following components:   WBC 10.7 (*)    RBC 3.91 (*)    Hemoglobin 12.3 (*)    HCT 35.3 (*)    Platelets 419 (*)    All other components within normal limits  ACETAMINOPHEN LEVEL - Abnormal; Notable for the following components:   Acetaminophen (Tylenol), Serum <10 (*)    All other components within normal limits  SALICYLATE LEVEL - Abnormal; Notable for the following components:   Salicylate Lvl <7.0 (*)    All other components within normal limits  RESP PANEL BY RT-PCR (FLU A&B, COVID) ARPGX2  ETHANOL    EKG None  Radiology No results found.  Procedures Procedures   Medications Ordered in ED Medications  melatonin tablet 9 mg (9 mg Oral Given 04/18/21 0227)  diphenhydrAMINE (BENADRYL) capsule 50 mg (50 mg Oral Given 04/18/21 0227)    ED Course  I have reviewed the triage vital signs and the nursing notes.  Pertinent labs & imaging results that were available during my care of the patient were reviewed by me and considered in my medical decision making (see chart for details).    MDM Rules/Calculators/A&P                          Patient with very mild hyponatremia and hypokalemia however this should not be affecting his psychosis.  I think is more likely methamphetamine however he has been involuntarily committed and is been going on for a few weeks.  Patient's been medically cleared I feel he is appropriate for TTS consultation and psychiatric recommendation at this time.  Final Clinical Impression(s) / ED Diagnoses Final diagnoses:  Methamphetamine  abuse [facility])    Rx / DC Orders ED Discharge Orders     None        Beatrix Breece, Barbara Cower, MD 04/18/21 [phone]

## 2021-04-18 NOTE — ED Notes (Signed)
Patient exited ED EMS entrance with this nurse and sitter with eyes on patient. Patient argumentative about leaving and states that he had "bad meth" and is fine now. Patient is alert and oriented. Security and [employer] at patient bedside at this time.

## 2021-04-18 NOTE — ED Triage Notes (Signed)
EMS brought in for mental Evaluation after pt called 911 and hung up. Pt seen here yesterday and discharged to home. Pt has hx of Meth use. Per EMS, pt with bizarre behavior.

## 2021-04-18 NOTE — Consult Note (Addendum)
Telepsych Consultation   Reason for Consult:  psych consult Referring Physician:  Marily Memos, MD Location of Patient:  [facility] Location of Provider: Behavioral Health TTS Department  Patient Identification: AMAHD MORINO MRN:  [id number] Principal Diagnosis: Amphetamine-induced psychotic disorder [facility]) Diagnosis:  Principal Problem:   Amphetamine-induced psychotic disorder [facility]) Active Problems:   Amphetamine use disorder, severe (HCC)   Total Time spent with patient: 20 minutes  Subjective:   CLEVER GERALDO is a 45 y.o. male patient admitted with amphetamine induced psychotic disorder.  "A bad batch that all" (regarding methamphetamine). They mixed it in. I felt off the wall that's all. That's all it was, bad dope. I'm ready to go home now. I feel fine now, they said last night I was talking out my mind but I'm okay now".   Patient presents alert and oriented; he denies any suicidal or homicidal ideation, auditory and visual hallucinations, and appears to be at baseline with no active psychosis noted.   Per EDRN note 04/18/21 1325: "Patient exited ED EMS entrance with this nurse and sitter with eyes on patient. Patient argumentative about leaving and states that he had "bad meth" and is fine now. Patient is alert and oriented. Security and [employer] at patient bedside at this time."  HPI:  JERALD HENNINGTON is a 45 year old male with a past history of amphetamine induced disorder, hallucinations, and psychosis who presented to [facility] via EMS for bizarre behavior after calling 911 and hanging up. Patient presented to [facility] day prior where he returned home. UDS+amphetamines; BAL<10.   Past Psychiatric History: amphetamine induced disorder, psychosis  Risk to Self:   Risk to Others:   Prior Inpatient Therapy:   Prior Outpatient Therapy:    Past Medical History:  Past Medical History:  Diagnosis Date   Chronic back pain     Past Surgical History:  Procedure Laterality Date    arm surgery  left     tendon and laceration repair   SHOULDER SURGERY     Family History: History reviewed. No pertinent family history. Family Psychiatric  History: not noted Social History:  Social History   Substance and Sexual Activity  Alcohol Use No     Social History   Substance and Sexual Activity  Drug Use No    Social History   Socioeconomic History   Marital status: Divorced    Spouse name: Not on file   Number of children: 2   Years of education: Not on file   Highest education level: Not on file  Occupational History   Not on file  Tobacco Use   Smoking status: Every Day    Packs/day: 1.00    Types: Cigarettes   Smokeless tobacco: Never  Vaping Use   Vaping Use: Never used  Substance and Sexual Activity   Alcohol use: No   Drug use: No   Sexual activity: Not on file  Other Topics Concern   Not on file  Social History Narrative   Not on file   Social Determinants of Health   Financial Resource Strain: Not on file  Food Insecurity: Not on file  Transportation Needs: Not on file  Physical Activity: Not on file  Stress: Not on file  Social Connections: Not on file   Additional Social History:    Allergies:  No Known Allergies  Labs:  Results for orders placed or performed during the hospital encounter of 04/18/21 (from the past 48 hour(s))  Urine rapid drug screen (  hosp performed)     Status: Abnormal   Collection Time: 04/18/21  2:13 AM  Result Value Ref Range   Opiates NONE DETECTED NONE DETECTED   Cocaine NONE DETECTED NONE DETECTED   Benzodiazepines NONE DETECTED NONE DETECTED   Amphetamines POSITIVE (A) NONE DETECTED   Tetrahydrocannabinol NONE DETECTED NONE DETECTED   Barbiturates NONE DETECTED NONE DETECTED    Comment: (NOTE) DRUG SCREEN FOR MEDICAL PURPOSES ONLY.  IF CONFIRMATION IS NEEDED FOR ANY PURPOSE, NOTIFY LAB WITHIN 5 DAYS.  LOWEST DETECTABLE LIMITS FOR URINE DRUG SCREEN Drug Class                     Cutoff  (ng/mL) Amphetamine and metabolites    1000 Barbiturate and metabolites    200 Benzodiazepine                 200 Tricyclics and metabolites     300 Opiates and metabolites        300 Cocaine and metabolites        300 THC                            50 Performed at [facility], [address], [place], [place] [address]   Comprehensive metabolic panel     Status: Abnormal   Collection Time: 04/18/21  2:41 AM  Result Value Ref Range   Sodium 133 (L) 135 - 145 mmol/L   Potassium 3.3 (L) 3.5 - 5.1 mmol/L   Chloride 102 98 - 111 mmol/L   CO2 21 (L) 22 - 32 mmol/L   Glucose, Bld 107 (H) 70 - 99 mg/dL    Comment: Glucose reference range applies only to samples taken after fasting for at least 8 hours.   BUN 10 6 - 20 mg/dL   Creatinine, Ser 1.69 0.61 - 1.24 mg/dL   Calcium 8.8 (L) 8.9 - 10.3 mg/dL   Total Protein 7.2 6.5 - 8.1 g/dL   Albumin 4.0 3.5 - 5.0 g/dL   AST 24 15 - 41 U/L   ALT 15 0 - 44 U/L   Alkaline Phosphatase 67 38 - 126 U/L   Total Bilirubin 1.1 0.3 - 1.2 mg/dL   GFR, Estimated >45 >03 mL/min    Comment: (NOTE) Calculated using the CKD-EPI Creatinine Equation (2021)    Anion gap 10 5 - 15    Comment: Performed at [facility], [address], [place], [place] [address]  CBC with Diff     Status: Abnormal   Collection Time: 04/18/21  2:41 AM  Result Value Ref Range   WBC 10.7 (H) 4.0 - 10.5 K/uL   RBC 3.91 (L) 4.22 - 5.81 MIL/uL   Hemoglobin 12.3 (L) 13.0 - 17.0 g/dL   HCT 00.3 (L) 49.1 - 79.1 %   MCV 90.3 80.0 - 100.0 fL   MCH 31.5 26.0 - 34.0 pg   MCHC 34.8 30.0 - 36.0 g/dL   RDW 50.5 69.7 - 94.8 %   Platelets 419 (H) 150 - 400 K/uL   nRBC 0.0 0.0 - 0.2 %   Neutrophils Relative % 66 %   Neutro Abs 7.1 1.7 - 7.7 K/uL   Lymphocytes Relative 22 %   Lymphs Abs 2.4 0.7 - 4.0 K/uL   Monocytes Relative 10 %   Monocytes Absolute 1.0 0.1 - 1.0 K/uL   Eosinophils Relative 1 %   Eosinophils Absolute 0.1 0.0 - 0.5 K/uL  Basophils Relative 1 %   Basophils Absolute  0.1 0.0 - 0.1 K/uL   Immature Granulocytes 0 %   Abs Immature Granulocytes 0.03 0.00 - 0.07 K/uL    Comment: Performed at [facility], [address] [place], [place] [address]  Ethanol     Status: None   Collection Time: 04/18/21  2:42 AM  Result Value Ref Range   Alcohol, Ethyl (B) <10 <10 mg/dL    Comment: (NOTE) Lowest detectable limit for serum alcohol is 10 mg/dL.  For medical purposes only. Performed at [facility], [address], [place], [place] [address]   Acetaminophen level     Status: Abnormal   Collection Time: 04/18/21  2:42 AM  Result Value Ref Range   Acetaminophen (Tylenol), Serum <10 (L) 10 - 30 ug/mL    Comment: (NOTE) Therapeutic concentrations vary significantly. A range of 10-30 ug/mL  may be an effective concentration for many patients. However, some  are best treated at concentrations outside of this range. Acetaminophen concentrations >150 ug/mL at 4 hours after ingestion  and >50 ug/mL at 12 hours after ingestion are often associated with  toxic reactions.  Performed at [facility], [address], [place], [place] [address]   Salicylate level     Status: Abnormal   Collection Time: 04/18/21  2:42 AM  Result Value Ref Range   Salicylate Lvl <7.0 (L) 7.0 - 30.0 mg/dL    Comment: Performed at [facility], [address] [place], [place] [address]    Medications:  Current Facility-Administered Medications  Medication Dose Route Frequency Provider Last Rate Last Admin   albuterol (VENTOLIN HFA) 108 (90 Base) MCG/ACT inhaler 2 puff  2 puff Inhalation Q6H PRN Mesner, Barbara Cower, MD       melatonin tablet 9 mg  9 mg Oral QHS Mesner, Jason, MD   9 mg at 04/18/21 0227   OLANZapine (ZYPREXA) tablet 20 mg  20 mg Oral QHS Mesner, Barbara Cower, MD       potassium chloride SA (KLOR-CON) CR tablet 40 mEq  40 mEq Oral Once Mesner, Barbara Cower, MD       Current Outpatient Medications  Medication Sig Dispense Refill   albuterol (PROVENTIL HFA;VENTOLIN HFA) 108 (90 BASE) MCG/ACT  inhaler Inhale 2 puffs into the lungs every 6 (six) hours as needed for wheezing or shortness of breath. (Patient not taking: Reported on 04/04/2021)     HYDROcodone-acetaminophen (NORCO) 5-325 MG tablet Take 1-2 tablets by mouth every 6 (six) hours as needed. (Patient not taking: Reported on 04/04/2021) 12 tablet 0   methocarbamol (ROBAXIN) 500 MG tablet Take 1 tablet (500 mg total) by mouth 2 (two) times daily. (Patient not taking: Reported on 04/04/2021) 20 tablet 0   nitrofurantoin, macrocrystal-monohydrate, (MACROBID) 100 MG capsule Take 1 capsule (100 mg total) by mouth 2 (two) times daily. (Patient not taking: Reported on 04/04/2021) 10 capsule 0   OLANZapine (ZYPREXA) 20 MG tablet Take 1 tablet (20 mg total) by mouth at bedtime. (Patient not taking: Reported on 04/04/2021) 30 tablet 0   phenazopyridine (PYRIDIUM) 200 MG tablet Take 1 tablet (200 mg total) by mouth 3 (three) times daily. (Patient not taking: Reported on 04/04/2021) 6 tablet 0   predniSONE (DELTASONE) 10 MG tablet Take 2 tablets (20 mg total) by mouth 2 (two) times daily. (Patient not taking: Reported on 04/04/2021) 20 tablet 0    Musculoskeletal: Strength & Muscle Tone: within normal limits Gait & Station: normal Patient leans: N/A  Psychiatric Specialty Exam:  Presentation  General Appearance: Casual  Eye Contact:Good  Speech:Clear and Coherent  Speech Volume:Normal  Handedness:No data recorded  Mood and Affect  Mood:Euthymic  Affect:Appropriate   Thought Process  Thought Processes:Goal Directed  Descriptions of Associations:Intact  Orientation:Partial  Thought Content:Logical  History of Schizophrenia/Schizoaffective disorder:No  Duration of Psychotic Symptoms:N/A  Hallucinations:Hallucinations: None Ideas of Reference:None  Suicidal Thoughts:Suicidal Thoughts: No Homicidal Thoughts:Homicidal Thoughts: No  Sensorium  Memory:Immediate Fair; Recent Fair; Remote  Fair  Judgment:Fair  Insight:Fair   Executive Functions  Concentration:Fair  Attention Span:Fair  Recall:Fair  Fund of Knowledge:Fair  Language:Fair   Psychomotor Activity  Psychomotor Activity:Psychomotor Activity: Normal  Assets  Assets:Desire for Improvement; Financial Resources/Insurance; Housing; Resilience; Social Support; Physical Health   Sleep  Sleep:Sleep: Fair   Physical Exam: Physical Exam Vitals and nursing note reviewed.  Constitutional:      General: He is not in acute distress.    Appearance: Normal appearance. He is normal weight. He is not ill-appearing.  HENT:     Head: Normocephalic.     Nose: Nose normal.     Mouth/Throat:     Mouth: Mucous membranes are moist.     Pharynx: Oropharynx is clear.  Eyes:     Pupils: Pupils are equal, round, and reactive to light.  Cardiovascular:     Rate and Rhythm: Normal rate.     Pulses: Normal pulses.  Pulmonary:     Effort: Pulmonary effort is normal.  Musculoskeletal:        General: Normal range of motion.     Cervical back: Normal range of motion.  Neurological:     Mental Status: He is alert. Mental status is at baseline.  Psychiatric:        Attention and Perception: He does not perceive auditory or visual hallucinations.        Mood and Affect: Mood and affect normal.        Speech: Speech normal.        Behavior: Behavior is not agitated, aggressive, withdrawn or combative. Behavior is cooperative.        Thought Content: Thought content is not paranoid or delusional. Thought content does not include homicidal or suicidal ideation. Thought content does not include homicidal or suicidal plan.        Cognition and Memory: Cognition and memory normal.        Judgment: Judgment is impulsive.   Review of Systems  Psychiatric/Behavioral:  Positive for substance abuse. Negative for depression, hallucinations, memory loss and suicidal ideas. The patient is not nervous/anxious and does not have  insomnia.   All other systems reviewed and are negative. Blood pressure 110/74, pulse 92, temperature 99.2 F (37.3 C), temperature source Oral, resp. rate 18, height 5\' 5"  (1.651 m), weight 61 kg, SpO2 99 %. Body mass index is 22.38 kg/m.  Treatment Plan Summary: Plan Patient is psychiatrically cleared for discharge.   Disposition: No evidence of imminent risk to self or others at present.   Patient does not meet criteria for psychiatric inpatient admission. Supportive therapy provided about ongoing stressors. Discussed crisis plan, support from social network, calling 911, coming to the Emergency Department, and calling Suicide Hotline.  This service was provided via telemedicine using a 2-way, interactive audio and video technology.  Names of all persons participating in this telemedicine service and their role in this encounter. Name: Role: PMHNP  Name: Maxie Barb Role: Attending MD  Name: Nelly Rout Cuffie Role: patient  Name:  Role:  Loletta Parish, NP 04/18/2021 2:50 PM

## 2021-04-22 ENCOUNTER — Emergency Department (HOSPITAL_COMMUNITY)
Admission: EM | Admit: 2021-04-22 | Discharge: 2021-04-22 | Disposition: A | Discharge status: Home / Self Care | Payer: Self-pay | Attending: Emergency Medicine | Admitting: Emergency Medicine

## 2021-04-22 ENCOUNTER — Other Ambulatory Visit: Payer: Self-pay

## 2021-04-22 ENCOUNTER — Encounter (HOSPITAL_COMMUNITY): Payer: Self-pay

## 2021-04-22 DIAGNOSIS — F17210 Nicotine dependence, cigarettes, uncomplicated: Secondary | ICD-10-CM | POA: Insufficient documentation

## 2021-04-22 DIAGNOSIS — F1510 Other stimulant abuse, uncomplicated: Secondary | ICD-10-CM

## 2021-04-22 DIAGNOSIS — F15959 Other stimulant use, unspecified with stimulant-induced psychotic disorder, unspecified: Secondary | ICD-10-CM | POA: Insufficient documentation

## 2021-04-22 DIAGNOSIS — Z79899 Other long term (current) drug therapy: Secondary | ICD-10-CM | POA: Insufficient documentation

## 2021-04-22 HISTORY — DX: Unspecified psychosis not due to a substance or known physiological condition: F29

## 2021-04-22 HISTORY — DX: Other psychoactive substance dependence, uncomplicated: F19.20

## 2021-04-22 LAB — CBC WITH DIFFERENTIAL/PLATELET
Abs Immature Granulocytes: 0.05 10*3/uL (ref 0.00–0.07)
Basophils Absolute: 0.1 10*3/uL (ref 0.0–0.1)
Basophils Relative: 1 %
Eosinophils Absolute: 0.1 10*3/uL (ref 0.0–0.5)
Eosinophils Relative: 1 %
HCT: 36.3 % — ABNORMAL LOW (ref 39.0–52.0)
Hemoglobin: 12.3 g/dL — ABNORMAL LOW (ref 13.0–17.0)
Immature Granulocytes: 0 %
Lymphocytes Relative: 23 %
Lymphs Abs: 2.6 10*3/uL (ref 0.7–4.0)
MCH: 31 pg (ref 26.0–34.0)
MCHC: 33.9 g/dL (ref 30.0–36.0)
MCV: 91.4 fL (ref 80.0–100.0)
Monocytes Absolute: 0.7 10*3/uL (ref 0.1–1.0)
Monocytes Relative: 6 %
Neutro Abs: 8 10*3/uL — ABNORMAL HIGH (ref 1.7–7.7)
Neutrophils Relative %: 69 %
Platelets: 386 10*3/uL (ref 150–400)
RBC: 3.97 MIL/uL — ABNORMAL LOW (ref 4.22–5.81)
RDW: 13.1 % (ref 11.5–15.5)
WBC: 11.5 10*3/uL — ABNORMAL HIGH (ref 4.0–10.5)
nRBC: 0 % (ref 0.0–0.2)

## 2021-04-22 LAB — COMPREHENSIVE METABOLIC PANEL
ALT: 13 U/L (ref 0–44)
AST: 16 U/L (ref 15–41)
Albumin: 3.9 g/dL (ref 3.5–5.0)
Alkaline Phosphatase: 69 U/L (ref 38–126)
Anion gap: 7 (ref 5–15)
BUN: 8 mg/dL (ref 6–20)
CO2: 25 mmol/L (ref 22–32)
Calcium: 9.5 mg/dL (ref 8.9–10.3)
Chloride: 102 mmol/L (ref 98–111)
Creatinine, Ser: 0.74 mg/dL (ref 0.61–1.24)
GFR, Estimated: >60 mL/min (ref >60)
Glucose, Bld: 105 mg/dL — ABNORMAL HIGH (ref 70–99)
Potassium: 3.8 mmol/L (ref 3.5–5.1)
Sodium: 134 mmol/L — ABNORMAL LOW (ref 135–145)
Total Bilirubin: 0.4 mg/dL (ref 0.3–1.2)
Total Protein: 7.1 g/dL (ref 6.5–8.1)

## 2021-04-22 LAB — ETHANOL: Alcohol, Ethyl (B): <10 mg/dL (ref <10)

## 2021-04-22 LAB — RAPID URINE DRUG SCREEN, HOSP PERFORMED
Amphetamines: NOT DETECTED
Barbiturates: NOT DETECTED
Benzodiazepines: NOT DETECTED
Cocaine: NOT DETECTED
Opiates: NOT DETECTED
Tetrahydrocannabinol: NOT DETECTED

## 2021-04-22 LAB — ACETAMINOPHEN LEVEL: Acetaminophen (Tylenol), Serum: <10 ug/mL — ABNORMAL LOW (ref 10–30)

## 2021-04-22 LAB — SALICYLATE LEVEL: Salicylate Lvl: <7 mg/dL — ABNORMAL LOW (ref 7.0–30.0)

## 2021-04-22 MED ORDER — LORAZEPAM 1 MG PO TABS
1.0000 mg | ORAL_TABLET | Freq: Once | ORAL | Status: AC
  Administered 2021-04-22: 1 mg via ORAL
  Filled 2021-04-22: qty 1

## 2021-04-22 NOTE — BH Assessment (Signed)
Attempted TTS assessment. Patient cannot stay awake long enough to respond to questions. Advised RN Jillene Bucks to let TTS know when pt was awake enough to participate in assessment.   Ronnie Wilson T. Jimmye Norman, MS, [facility], [facility] Triage Specialist [facility]

## 2021-04-22 NOTE — ED Notes (Signed)
Pt requests "I don't want any visitors and no one coming into my room other than staff". "Ask the doctor for something to help me relax". Spoke to edp regarding pt's request.

## 2021-04-22 NOTE — ED Provider Notes (Signed)
Patient seen by TTS.  He does not meet inpatient criteria and is cleared for discharge.  Resources given.  He denies feeling suicidal or homicidal.   Glynn Octave, MD 04/22/21 [phone]

## 2021-04-22 NOTE — ED Notes (Addendum)
Pt alert eating dinner. States that he got hold of "some bad stuff, meth". Reports that he did not intentionally try to hurt himself. Does not know what was in the meth he used.
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
## 2021-04-22 NOTE — Progress Notes (Signed)
PER TTS NOTE: TTS completed. Per Nira Conn, NP, patient is psych cleared. Patient does not meet criteria for inpatient psychiatric treatment. Denies SI, HI, and AVH's.  Referrals have been entered into patient's AVS.  This CSW will now remove from [facility] shift report and TOC CSW/ team will follow pt with discharge needs.   Maryjean Ka, MSW, LCSWA 04/22/2021 11:45 PM

## 2021-04-22 NOTE — ED Notes (Signed)
TTS assessment begun 

## 2021-04-22 NOTE — ED Provider Notes (Signed)
[facility] EMERGENCY DEPARTMENT Provider Note   CSN: [id number] Arrival date & time: 04/22/21  0351     History Chief Complaint  Patient presents with   Paranoid    Ronnie Wilson is a 45 y.o. male.  Patient with history of amphetamine abuse and chronic pain here by EMS with thoughts of "someone after me".  This has been ongoing for the past several days.  He admits to using IV methamphetamine as well as snorting methamphetamine.  States he is hearing "chatter" in his head and hearing multiple voices but is not sure what they are saying.  He is concerned someone could be after him and trying to hurt him but no one in particular.  He denies feeling suicidal.  He denies feeling like he wants anyone else.  Denies any other drug use. Denies any physical complaint.  No headache.  No chest pain or shortness of breath.  No abdominal pain, nausea or vomiting. Similar ED visit on November 26 and was cleared by psychiatry He states he wants to be admitted or go to rehab but does not want to go to [facility] or [facility].  The history is provided by the patient and the EMS personnel. The history is limited by the condition of the patient.      Past Medical History:  Diagnosis Date   Chronic back pain    Drug abuse and dependence [facility])     Patient Active Problem List   Diagnosis Date Noted   Amphetamine use disorder, severe (HCC) 04/18/2021   Psychosis (HCC) 04/04/2021   Amphetamine-induced psychotic disorder (HCC) 06/18/2019    Past Surgical History:  Procedure Laterality Date   arm surgery  left     tendon and laceration repair   SHOULDER SURGERY         History reviewed. No pertinent family history.  Social History   Tobacco Use   Smoking status: Every Day    Packs/day: 1.00    Types: Cigarettes   Smokeless tobacco: Never  Vaping Use   Vaping Use: Never used  Substance Use Topics   Alcohol use: Yes   Drug use: Yes    Types: Methamphetamines    Home  Medications Prior to Admission medications   Medication Sig Start Date End Date Taking? Authorizing Provider  albuterol (PROVENTIL HFA;VENTOLIN HFA) 108 (90 BASE) MCG/ACT inhaler Inhale 2 puffs into the lungs every 6 (six) hours as needed for wheezing or shortness of breath. Patient not taking: Reported on 04/04/2021    [provider]  HYDROcodone-acetaminophen (NORCO) 5-325 MG tablet Take 1-2 tablets by mouth every 6 (six) hours as needed. Patient not taking: Reported on 04/04/2021 11/07/20   Geoffery Lyons, MD  methocarbamol (ROBAXIN) 500 MG tablet Take 1 tablet (500 mg total) by mouth 2 (two) times daily. Patient not taking: Reported on 04/04/2021 12/24/19   Bethel Born, PA-C  nitrofurantoin, macrocrystal-monohydrate, (MACROBID) 100 MG capsule Take 1 capsule (100 mg total) by mouth 2 (two) times daily. Patient not taking: Reported on 04/04/2021 12/21/19   Moshe Cipro, NP  OLANZapine (ZYPREXA) 20 MG tablet Take 1 tablet (20 mg total) by mouth at bedtime. Patient not taking: Reported on 04/04/2021 06/19/19   Aldean Baker, NP  phenazopyridine (PYRIDIUM) 200 MG tablet Take 1 tablet (200 mg total) by mouth 3 (three) times daily. Patient not taking: Reported on 04/04/2021 12/21/19   Moshe Cipro, NP  predniSONE (DELTASONE) 10 MG tablet Take 2 tablets (20 mg total) by mouth  2 (two) times daily. Patient not taking: Reported on 04/04/2021 11/07/20   Geoffery Lyons, MD    Allergies    Patient has no known allergies.  Review of Systems   Review of Systems  Constitutional:  Negative for activity change, appetite change and fever.  HENT:  Negative for congestion and dental problem.   Respiratory:  Negative for cough, chest tightness and shortness of breath.   Cardiovascular:  Negative for chest pain.  Gastrointestinal:  Negative for abdominal pain, nausea and vomiting.  Genitourinary:  Negative for dysuria and hematuria.  Musculoskeletal:  Negative for arthralgias and  myalgias.  Skin:  Negative for rash.  Neurological:  Negative for dizziness, weakness and headaches.  Hematological:  Negative for adenopathy.  Psychiatric/Behavioral:  Positive for decreased concentration, dysphoric mood and hallucinations. Negative for self-injury and suicidal ideas. The patient is nervous/anxious.    all other systems are negative except as noted in the HPI and PMH.   Physical Exam Updated Vital Signs BP (!) 145/97   Pulse 87   Temp 98.6 F (37 C)   Resp 19   Ht 5\' 5"  (1.651 m)   Wt 61 kg   SpO2 99%   BMI 22.38 kg/m   Physical Exam Vitals and nursing note reviewed.  Constitutional:      General: He is not in acute distress.    Appearance: He is well-developed.  HENT:     Head: Normocephalic and atraumatic.     Mouth/Throat:     Pharynx: No oropharyngeal exudate.  Eyes:     Conjunctiva/sclera: Conjunctivae normal.     Pupils: Pupils are equal, round, and reactive to light.  Neck:     Comments: No meningismus. Cardiovascular:     Rate and Rhythm: Normal rate and regular rhythm.     Heart sounds: Normal heart sounds. No murmur heard. Pulmonary:     Effort: Pulmonary effort is normal. No respiratory distress.     Breath sounds: Normal breath sounds.  Abdominal:     Palpations: Abdomen is soft.     Tenderness: There is no abdominal tenderness. There is no guarding or rebound.  Musculoskeletal:        General: No tenderness. Normal range of motion.     Cervical back: Normal range of motion and neck supple.  Skin:    General: Skin is warm.  Neurological:     Mental Status: He is alert and oriented to person, place, and time.     Cranial Nerves: No cranial nerve deficit.     Motor: No abnormal muscle tone.     Coordination: Coordination normal.     Comments:  5/5 strength throughout. CN 2-12 intact.Equal grip strength.   Psychiatric:        Behavior: Behavior normal.    ED Results / Procedures / Treatments   Labs (all labs ordered are listed,  but only abnormal results are displayed) Labs Reviewed  CBC WITH DIFFERENTIAL/PLATELET - Abnormal; Notable for the following components:      Result Value   WBC 11.5 (*)    RBC 3.97 (*)    Hemoglobin 12.3 (*)    HCT 36.3 (*)    Neutro Abs 8.0 (*)    All other components within normal limits  COMPREHENSIVE METABOLIC PANEL - Abnormal; Notable for the following components:   Sodium 134 (*)    Glucose, Bld 105 (*)    All other components within normal limits  ACETAMINOPHEN LEVEL - Abnormal; Notable for the following components:  Acetaminophen (Tylenol), Serum <10 (*)    All other components within normal limits  SALICYLATE LEVEL - Abnormal; Notable for the following components:   Salicylate Lvl [id number] (*)    All other components within normal limits  RAPID URINE DRUG SCREEN, HOSP PERFORMED  ETHANOL    EKG None  Radiology No results found.  Procedures Procedures   Medications Ordered in ED Medications - No data to display  ED Course  I have reviewed the triage vital signs and the nursing notes.  Pertinent labs & imaging results that were available during my care of the patient were reviewed by me and considered in my medical decision making (see chart for details).    MDM Rules/Calculators/A&P                          Methamphetamine abuse and paranoia.  Vital stable, calm and cooperative.  He is concerned "someone is after me".  Denies any suicidal or homicidal thoughts  Screening labs are reassuring.  Urine drug screen is negative.  Patient is medically clear for TTS evaluation.  Final Clinical Impression(s) / ED Diagnoses Final diagnoses:  None    Rx / DC Orders ED Discharge Orders     None        Theadore Blunck, Annie Main, MD 04/22/21 [phone]

## 2021-04-22 NOTE — ED Triage Notes (Signed)
Rcems from home with cc of paranoia thinking some one is out to get him.  "Wanted to go to the hospital"  Hx of meth abuse

## 2021-04-22 NOTE — Discharge Instructions (Signed)
Stop using amphetamines and other illicit drugs.  Follow-up with the resources provided.  Return to the ED with new or worsening symptoms.

## 2021-04-22 NOTE — ED Notes (Addendum)
TTS assessment completed. 

## 2021-04-22 NOTE — ED Provider Notes (Signed)
Emergency Medicine Observation Re-evaluation Note  Ronnie Wilson is a 45 y.o. male, seen on rounds today.  Pt initially presented to the ED for complaints of Paranoid and Meth Use Currently, the patient is calm, in no distress.  Physical Exam  BP (!) 145/97   Pulse 90   Temp 98.6 F (37 C)   Resp 19   Ht 5\' 5"  (1.651 m)   Wt 61 kg   SpO2 100%   BMI 22.38 kg/m  Physical Exam General: Resting, calm Cardiac: Regular rate and rhythm Lungs: Breathing easily Psych: Unremarkable  ED Course / MDM  EKG:   I have reviewed the labs performed to date as well as medications administered while in observation.  Recent changes in the last 24 hours include none.  Plan  Current plan is for behavioral health evaluation.  Ronnie Wilson is not under involuntary commitment.     Veverly Fells, MD 04/22/21 [phone]

## 2022-04-28 IMAGING — DX DG SHOULDER 2+V*R*
3 series · 3 of 3 positions shown · non-contrast
Comparison: 02/21/2015

CLINICAL DATA: Right shoulder pain

EXAM:
RIGHT SHOULDER - 2+ VIEW

[shoulder grashey]
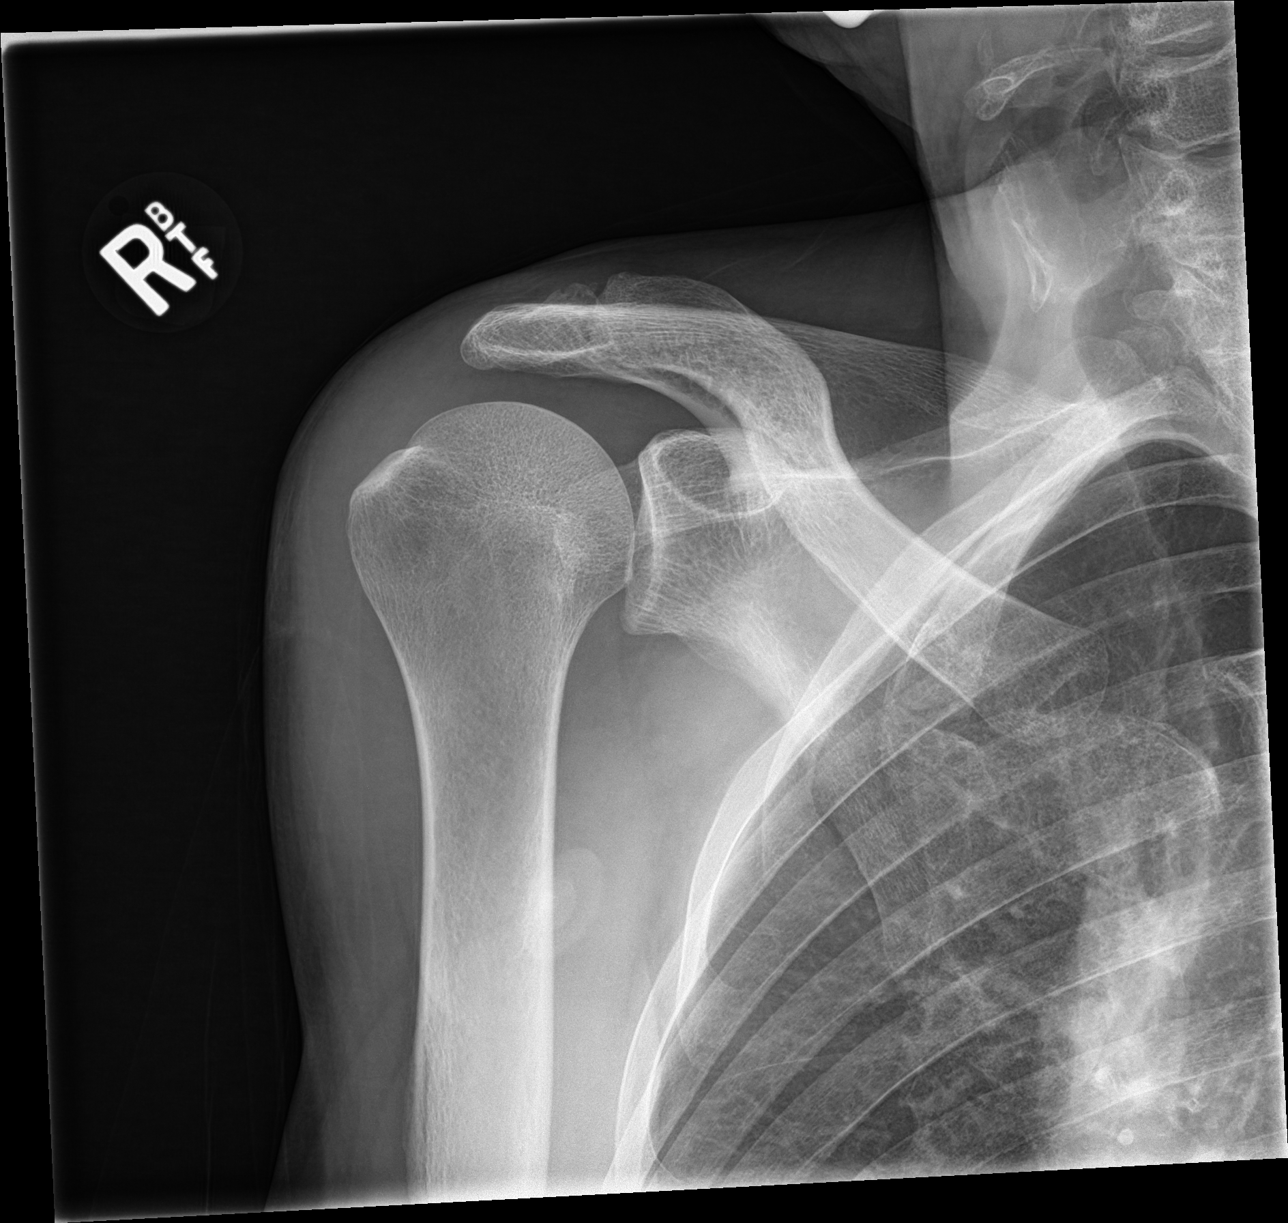

[shoulder y view]
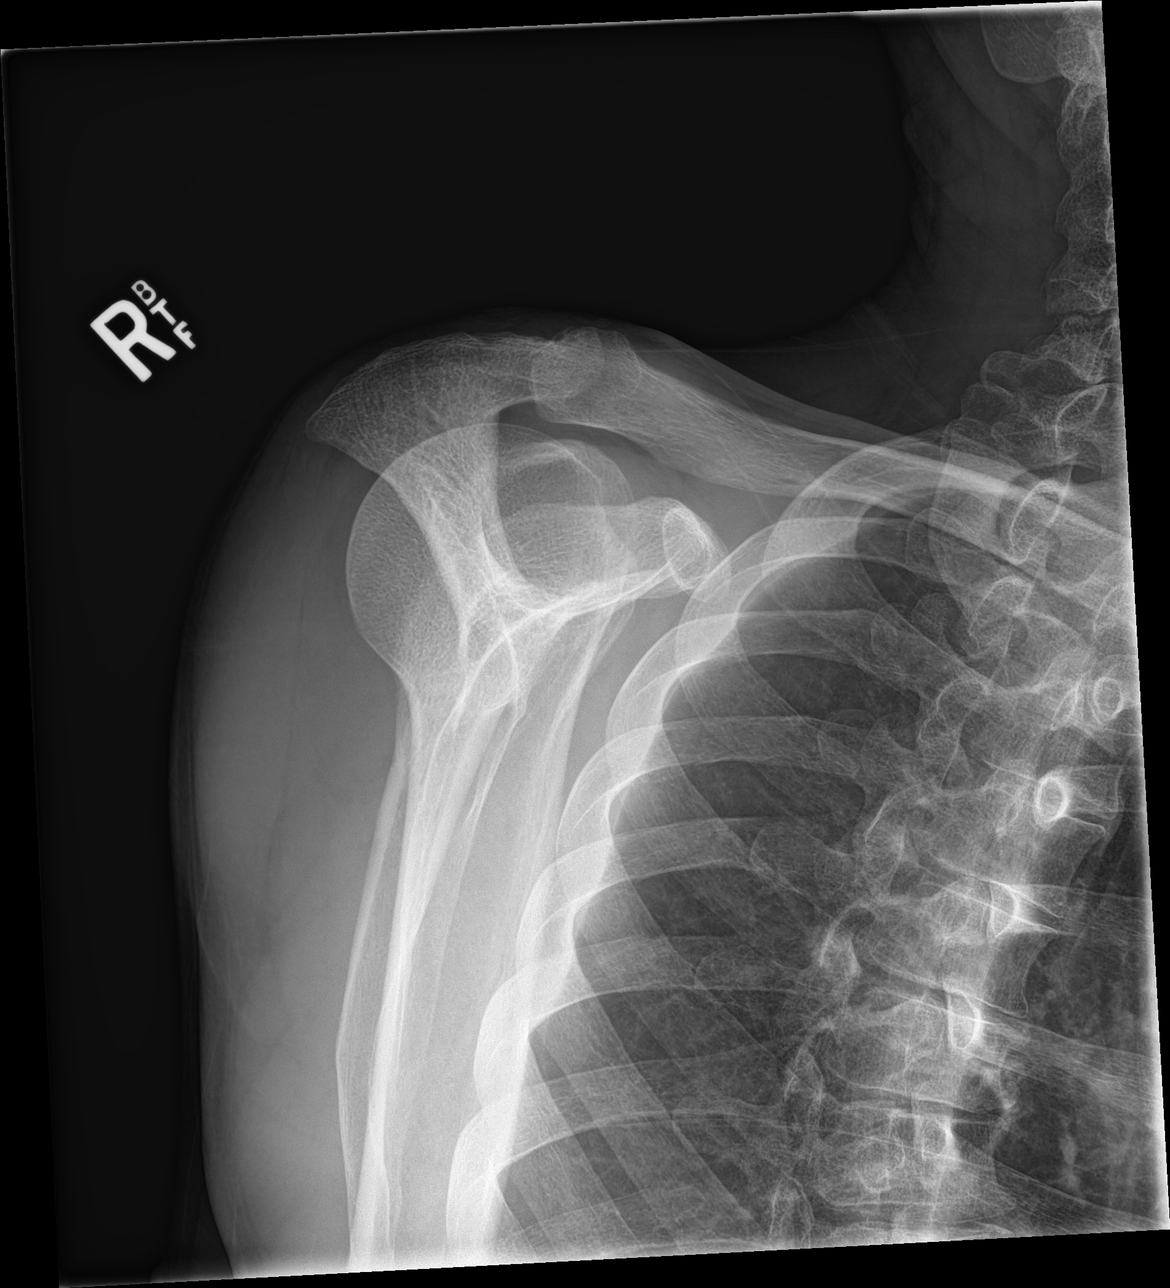

[shoulder axillary]
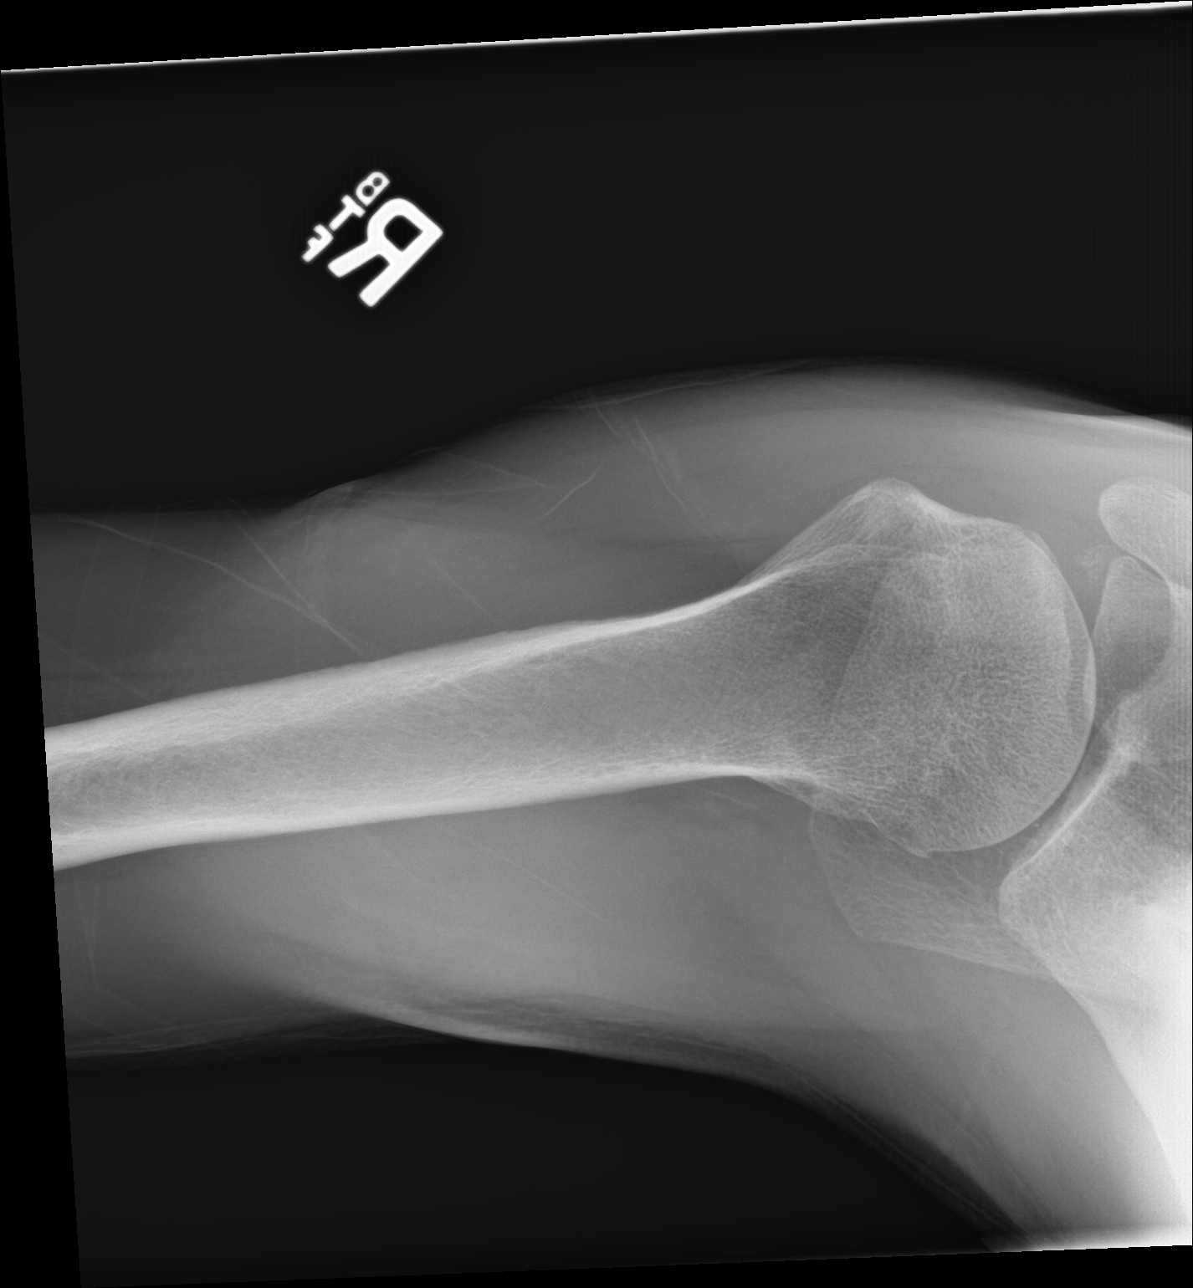

[3 of 3 positions shown; findings below may reference images not displayed]

FINDINGS: There is no evidence of fracture or dislocation. There is no
evidence of arthropathy or other focal bone abnormality. Soft
tissues are unremarkable.
IMPRESSION: No acute abnormality noted.

## 2022-06-22 DIAGNOSIS — M1990 Unspecified osteoarthritis, unspecified site: Secondary | ICD-10-CM | POA: Diagnosis not present

## 2022-06-22 DIAGNOSIS — M519 Unspecified thoracic, thoracolumbar and lumbosacral intervertebral disc disorder: Secondary | ICD-10-CM | POA: Diagnosis not present

## 2022-06-22 DIAGNOSIS — F23 Brief psychotic disorder: Secondary | ICD-10-CM | POA: Diagnosis not present

## 2022-06-22 DIAGNOSIS — R69 Illness, unspecified: Secondary | ICD-10-CM | POA: Diagnosis not present

## 2022-06-22 DIAGNOSIS — M5430 Sciatica, unspecified side: Secondary | ICD-10-CM | POA: Diagnosis not present

## 2022-06-28 DIAGNOSIS — F1120 Opioid dependence, uncomplicated: Secondary | ICD-10-CM | POA: Diagnosis not present

## 2022-06-29 DIAGNOSIS — M519 Unspecified thoracic, thoracolumbar and lumbosacral intervertebral disc disorder: Secondary | ICD-10-CM | POA: Diagnosis not present

## 2022-06-29 DIAGNOSIS — M5430 Sciatica, unspecified side: Secondary | ICD-10-CM | POA: Diagnosis not present

## 2022-06-29 DIAGNOSIS — M1990 Unspecified osteoarthritis, unspecified site: Secondary | ICD-10-CM | POA: Diagnosis not present

## 2022-06-29 DIAGNOSIS — R69 Illness, unspecified: Secondary | ICD-10-CM | POA: Diagnosis not present

## 2022-06-29 DIAGNOSIS — F23 Brief psychotic disorder: Secondary | ICD-10-CM | POA: Diagnosis not present

## 2022-07-05 ENCOUNTER — Emergency Department (HOSPITAL_COMMUNITY): Payer: 59

## 2022-07-05 ENCOUNTER — Emergency Department (HOSPITAL_COMMUNITY)
Admission: EM | Admit: 2022-07-05 | Discharge: 2022-07-06 | Disposition: A | Discharge status: Home / Self Care | Payer: 59 | Attending: Emergency Medicine | Admitting: Emergency Medicine

## 2022-07-05 ENCOUNTER — Encounter (HOSPITAL_COMMUNITY): Payer: Self-pay | Admitting: *Deleted

## 2022-07-05 ENCOUNTER — Other Ambulatory Visit: Payer: Self-pay

## 2022-07-05 DIAGNOSIS — R42 Dizziness and giddiness: Secondary | ICD-10-CM | POA: Diagnosis not present

## 2022-07-05 DIAGNOSIS — T43592A Poisoning by other antipsychotics and neuroleptics, intentional self-harm, initial encounter: Secondary | ICD-10-CM | POA: Diagnosis not present

## 2022-07-05 DIAGNOSIS — R4182 Altered mental status, unspecified: Secondary | ICD-10-CM | POA: Diagnosis not present

## 2022-07-05 DIAGNOSIS — T50901A Poisoning by unspecified drugs, medicaments and biological substances, accidental (unintentional), initial encounter: Secondary | ICD-10-CM

## 2022-07-05 DIAGNOSIS — E860 Dehydration: Secondary | ICD-10-CM | POA: Insufficient documentation

## 2022-07-05 DIAGNOSIS — R5383 Other fatigue: Secondary | ICD-10-CM | POA: Diagnosis not present

## 2022-07-05 DIAGNOSIS — R69 Illness, unspecified: Secondary | ICD-10-CM | POA: Diagnosis not present

## 2022-07-05 LAB — SALICYLATE LEVEL: Salicylate Lvl: <7 mg/dL — ABNORMAL LOW (ref 7.0–30.0)

## 2022-07-05 LAB — COMPREHENSIVE METABOLIC PANEL
ALT: 11 U/L (ref 0–44)
AST: 24 U/L (ref 15–41)
Albumin: 3.9 g/dL (ref 3.5–5.0)
Alkaline Phosphatase: 83 U/L (ref 38–126)
Anion gap: 13 (ref 5–15)
BUN: 20 mg/dL (ref 6–20)
CO2: 21 mmol/L — ABNORMAL LOW (ref 22–32)
Calcium: 8.9 mg/dL (ref 8.9–10.3)
Chloride: 99 mmol/L (ref 98–111)
Creatinine, Ser: 1.26 mg/dL — ABNORMAL HIGH (ref 0.61–1.24)
GFR, Estimated: >60 mL/min (ref >60)
Glucose, Bld: 159 mg/dL — ABNORMAL HIGH (ref 70–99)
Potassium: 3.1 mmol/L — ABNORMAL LOW (ref 3.5–5.1)
Sodium: 133 mmol/L — ABNORMAL LOW (ref 135–145)
Total Bilirubin: 0.5 mg/dL (ref 0.3–1.2)
Total Protein: 7.1 g/dL (ref 6.5–8.1)

## 2022-07-05 LAB — CBC WITH DIFFERENTIAL/PLATELET
Abs Immature Granulocytes: 0.05 10*3/uL (ref 0.00–0.07)
Basophils Absolute: 0.1 10*3/uL (ref 0.0–0.1)
Basophils Relative: 1 %
Eosinophils Absolute: 0.2 10*3/uL (ref 0.0–0.5)
Eosinophils Relative: 2 %
HCT: 37.2 % — ABNORMAL LOW (ref 39.0–52.0)
Hemoglobin: 12.1 g/dL — ABNORMAL LOW (ref 13.0–17.0)
Immature Granulocytes: 0 %
Lymphocytes Relative: 37 %
Lymphs Abs: 4.2 10*3/uL — ABNORMAL HIGH (ref 0.7–4.0)
MCH: 30.7 pg (ref 26.0–34.0)
MCHC: 32.5 g/dL (ref 30.0–36.0)
MCV: 94.4 fL (ref 80.0–100.0)
Monocytes Absolute: 1 10*3/uL (ref 0.1–1.0)
Monocytes Relative: 9 %
Neutro Abs: 5.8 10*3/uL (ref 1.7–7.7)
Neutrophils Relative %: 51 %
Platelets: 367 10*3/uL (ref 150–400)
RBC: 3.94 MIL/uL — ABNORMAL LOW (ref 4.22–5.81)
RDW: 13 % (ref 11.5–15.5)
WBC: 11.2 10*3/uL — ABNORMAL HIGH (ref 4.0–10.5)
nRBC: 0 % (ref 0.0–0.2)

## 2022-07-05 LAB — RAPID URINE DRUG SCREEN, HOSP PERFORMED
Amphetamines: POSITIVE — AB
Barbiturates: NOT DETECTED
Benzodiazepines: NOT DETECTED
Cocaine: NOT DETECTED
Opiates: NOT DETECTED
Tetrahydrocannabinol: NOT DETECTED

## 2022-07-05 LAB — URINALYSIS, ROUTINE W REFLEX MICROSCOPIC
Bilirubin Urine: NEGATIVE
Glucose, UA: NEGATIVE mg/dL
Hgb urine dipstick: NEGATIVE
Ketones, ur: 5 mg/dL — AB
Leukocytes,Ua: NEGATIVE
Nitrite: NEGATIVE
Protein, ur: NEGATIVE mg/dL
Specific Gravity, Urine: 1.011 (ref 1.005–1.030)
pH: 6 (ref 5.0–8.0)

## 2022-07-05 LAB — CBG MONITORING, ED: Glucose-Capillary: 169 mg/dL — ABNORMAL HIGH (ref 70–99)

## 2022-07-05 LAB — ACETAMINOPHEN LEVEL: Acetaminophen (Tylenol), Serum: <10 ug/mL — ABNORMAL LOW (ref 10–30)

## 2022-07-05 LAB — CK: Total CK: 163 U/L (ref 49–397)

## 2022-07-05 LAB — MAGNESIUM: Magnesium: 2 mg/dL (ref 1.7–2.4)

## 2022-07-05 LAB — ETHANOL: Alcohol, Ethyl (B): <10 mg/dL (ref <10)

## 2022-07-05 MED ORDER — NALOXONE HCL 4 MG/0.1ML NA LIQD
NASAL | Status: AC
  Filled 2022-07-05: qty 4

## 2022-07-05 MED ORDER — LACTATED RINGERS IV BOLUS
1000.0000 mL | Freq: Once | INTRAVENOUS | Status: AC
  Administered 2022-07-05: 1000 mL via INTRAVENOUS

## 2022-07-05 MED ORDER — NALOXONE HCL 0.4 MG/ML IJ SOLN
0.4000 mg | Freq: Once | INTRAMUSCULAR | Status: AC
  Administered 2022-07-05: 0.4 mg via INTRAVENOUS

## 2022-07-05 MED ORDER — NALOXONE HCL 0.4 MG/ML IJ SOLN
0.4000 mg | Freq: Once | INTRAMUSCULAR | Status: AC
  Administered 2022-07-05: 0.4 mg via INTRAVENOUS
  Filled 2022-07-05: qty 1

## 2022-07-05 NOTE — ED Provider Notes (Signed)
[facility] Provider Note   CSN: [id number] Arrival date & time: 07/05/22  1524     History {Add pertinent medical, surgical, social history, OB history to HPI:1} Chief Complaint  Patient presents with   Drug Overdose    Ronnie Wilson is a 47 y.o. male.   Drug Overdose  Patient presents for near syncope.  Medical history includes amphetamine use disorder, chronic pain.  Patient reports that he was in his normal state of health this morning.  This afternoon, while talking on the phone, he took 5 to 6 tablets of his Seroquel.  This is a new medication for him.  He denies this was an attempt of self-harm.  He is unable to describe why he took extra Seroquel.  He states that he also did some methamphetamines today.  He takes meth by snorting.  He denies any other coingestions.  Shortly after taking his medication, patient began to feel lightheaded and dizzy.  He denies loss of consciousness.  Currently, he feels slightly better.  He does feel fatigued.     Home Medications Prior to Admission medications   Medication Sig Start Date End Date Taking? Authorizing Provider  albuterol (PROVENTIL HFA;VENTOLIN HFA) 108 (90 BASE) MCG/ACT inhaler Inhale 2 puffs into the lungs every 6 (six) hours as needed for wheezing or shortness of breath. Patient not taking: Reported on 04/04/2021    [provider]  HYDROcodone-acetaminophen (NORCO) 5-325 MG tablet Take 1-2 tablets by mouth every 6 (six) hours as needed. Patient not taking: Reported on 04/04/2021 11/07/20   Veryl Speak, MD  methocarbamol (ROBAXIN) 500 MG tablet Take 1 tablet (500 mg total) by mouth 2 (two) times daily. Patient not taking: Reported on 04/04/2021 12/24/19   Recardo Evangelist, PA-C  nitrofurantoin, macrocrystal-monohydrate, (MACROBID) 100 MG capsule Take 1 capsule (100 mg total) by mouth 2 (two) times daily. Patient not taking: Reported on 04/04/2021 12/21/19   Faustino Congress, NP  OLANZapine (ZYPREXA) 20 MG tablet Take 1 tablet (20 mg total) by mouth at bedtime. Patient not taking: Reported on 04/04/2021 06/19/19   Connye Burkitt, NP  phenazopyridine (PYRIDIUM) 200 MG tablet Take 1 tablet (200 mg total) by mouth 3 (three) times daily. Patient not taking: Reported on 04/04/2021 12/21/19   Faustino Congress, NP  predniSONE (DELTASONE) 10 MG tablet Take 2 tablets (20 mg total) by mouth 2 (two) times daily. Patient not taking: Reported on 04/04/2021 11/07/20   Veryl Speak, MD      Allergies    Patient has no known allergies.    Review of Systems   Review of Systems  Constitutional:  Positive for fatigue.  Neurological:  Positive for dizziness and light-headedness.  All other systems reviewed and are negative.   Physical Exam Updated Vital Signs BP (!) 77/51   Pulse (!) 103   Resp 11   SpO2 96%  Physical Exam Vitals and nursing note reviewed.  Constitutional:      General: He is not in acute distress.    Appearance: Normal appearance. He is well-developed. He is not ill-appearing, toxic-appearing or diaphoretic.     Comments: Somnolent  HENT:     Head: Normocephalic and atraumatic.     Right Ear: External ear normal.     Left Ear: External ear normal.     Nose: Nose normal.     Mouth/Throat:     Mouth: Mucous membranes are moist.  Eyes:     Conjunctiva/sclera: Conjunctivae  normal.     Comments: Pupils 2 mm bilaterally  Cardiovascular:     Rate and Rhythm: Normal rate and regular rhythm.     Heart sounds: No murmur heard. Pulmonary:     Effort: Pulmonary effort is normal. No respiratory distress.     Breath sounds: Normal breath sounds. No wheezing or rales.  Abdominal:     Palpations: Abdomen is soft.     Tenderness: There is no abdominal tenderness.  Musculoskeletal:        General: No swelling. Normal range of motion.     Cervical back: Normal range of motion and neck supple.     Right lower leg: No edema.     Left lower leg:  No edema.  Skin:    General: Skin is warm and dry.     Capillary Refill: Capillary refill takes less than 2 seconds.     Coloration: Skin is not jaundiced or pale.  Neurological:     General: No focal deficit present.     Mental Status: He is alert and oriented to person, place, and time.     Cranial Nerves: No cranial nerve deficit.     Sensory: No sensory deficit.     Motor: No weakness.     Coordination: Coordination normal.  Psychiatric:        Mood and Affect: Mood normal.        Behavior: Behavior normal.        Thought Content: Thought content normal.        Judgment: Judgment normal.     ED Results / Procedures / Treatments   Labs (all labs ordered are listed, but only abnormal results are displayed) Labs Reviewed  CBG MONITORING, ED - Abnormal; Notable for the following components:      Result Value   Glucose-Capillary 169 (*)    All other components within normal limits  SALICYLATE LEVEL  ACETAMINOPHEN LEVEL  ETHANOL  COMPREHENSIVE METABOLIC PANEL  CBC WITH DIFFERENTIAL/PLATELET  CBG MONITORING, ED    EKG EKG Interpretation  Date/Time:  Monday July 05 2022 15:34:26 EST Ventricular Rate:  98 PR Interval:  122 QRS Duration: 84 QT Interval:  370 QTC Calculation: 472 R Axis:   55 Text Interpretation: Normal sinus rhythm Normal ECG When compared with ECG of 01-Apr-2021 01:56, Criteria for Septal infarct are no longer Present QT has lengthened Confirmed by Margaretmary Eddy [phone]) on 07/05/2022 3:41:08 PM  Radiology No results found.  Procedures Procedures  {Document cardiac monitor, telemetry assessment procedure when appropriate:1}  Medications Ordered in ED Medications  naloxone (NARCAN) injection 0.4 mg (0.4 mg Intravenous Given 07/05/22 1544)    ED Course/ Medical Decision Making/ A&P   {   Click here for ABCD2, HEART and other calculatorsREFRESH Note before signing :1}                          Medical Decision Making Amount and/or  Complexity of Data Reviewed Labs: ordered.  Risk Prescription drug management.   This patient presents to the ED for concern of ***, this involves an extensive number of treatment options, and is a complaint that carries with it a high risk of complications and morbidity.  The differential diagnosis includes ***   Co morbidities that complicate the patient evaluation  ***   Additional history obtained:  Additional history obtained from *** External records from outside source obtained and reviewed including ***   Lab Tests:  I Ordered,  and personally interpreted labs.  The pertinent results include:  ***   Imaging Studies ordered:  I ordered imaging studies including ***  I independently visualized and interpreted imaging which showed *** I agree with the radiologist interpretation   Cardiac Monitoring: / EKG:  The patient was maintained on a cardiac monitor.  I personally viewed and interpreted the cardiac monitored which showed an underlying rhythm of: ***   Consultations Obtained:  I requested consultation with the ***,  and discussed lab and imaging findings as well as pertinent plan - they recommend: ***   Problem List / ED Course / Critical interventions / Medication management  47 year old male presenting for symptoms of near syncope.  This follows him taking extra tablets of his Seroquel.  He denies that this was attempted self-harm.  He also endorses methamphetamine use today.  Patient arrives by POV.  Vital signs on arrival were notable for hypotension.  IV fluids were started.  On exam, patient is alert and oriented and able to provide story.  He is somnolent and has some slurred speech at this time.  Pupils are small, suggestive of opiate narcosis.  Although he denies narcotic use, this may have been mixed in with his methamphetamines.  Although he did receive 1.1 mg of Narcan, will provide additional Narcan for empiric treatment of possible opiate component  to his current condition.  Patient's blood pressure improved with IV fluids.  I spoke with poison control who states that he does not need observation for a 300 mg dose of Seroquel, as this is some people's daily dose and should not cause toxicity.*** I ordered medication including ***  for ***  Reevaluation of the patient after these medicines showed that the patient {resolved/improved/worsened:23923::"improved"} I have reviewed the patients home medicines and have made adjustments as needed   Social Determinants of Health:  ***   Test / Admission - Considered:  ***   {Document critical care time when appropriate:1} {Document review of labs and clinical decision tools ie heart score, Chads2Vasc2 etc:1}  {Document your independent review of radiology images, and any outside records:1} {Document your discussion with family members, caretakers, and with consultants:1} {Document social determinants of health affecting pt's care:1} {Document your decision making why or why not admission, treatments were needed:1} Final Clinical Impression(s) / ED Diagnoses Final diagnoses:  None    Rx / DC Orders ED Discharge Orders     None

## 2022-07-05 NOTE — ED Notes (Addendum)
This RN spoke with poison control and they recommend bladder scanning pt to see pt is retaining. If pt is retaining PC recommends cathing pt for urine. PC states that if pt becomes alert and is able to urinate on own then he should be cleared on their end. Poison control to call back in few hours for update.

## 2022-07-05 NOTE — ED Triage Notes (Signed)
Pt came in and passed out at registration, the triage nurse went to check on pt and found him lying on the floor. Pt did not respond to sternal rub or painful stimuli  Pt assisted to wheelchair and brought to bed; pt unable to stay awake during assessment; pt admits to taking seroquel

## 2022-07-05 NOTE — ED Provider Notes (Addendum)
Patient brought back for immediate evaluation after he was found in triage to be nodding off.  Reports to taking a medication that he believes is Seroquel.  Took 5-6 instead of his usual 1 pill at bedtime. Patient does appear drowsy but is alert and moving all 4 extremities.  Have ordered initial blood work and Narcan to be given in 0.1 mg doses until he becomes more responsive.  Will also place the patient in a room and on end-tidal at this time.  Please see provider note for complete evaluation.  Later told another provider that he was doing ice.   Fransico Meadow, MD 07/05/22 [phone]

## 2022-07-05 NOTE — ED Notes (Signed)
Pt able to urinate in urinal on his own. Output 365m.

## 2022-07-06 DIAGNOSIS — F1120 Opioid dependence, uncomplicated: Secondary | ICD-10-CM | POA: Diagnosis not present

## 2022-07-06 MED ORDER — POTASSIUM CHLORIDE CRYS ER 20 MEQ PO TBCR
40.0000 meq | EXTENDED_RELEASE_TABLET | Freq: Once | ORAL | Status: AC
  Administered 2022-07-06: 40 meq via ORAL
  Filled 2022-07-06: qty 2

## 2022-07-06 NOTE — Discharge Instructions (Signed)
Your lab work shows dehydration.  You were given IV fluid in the emergency department.  You should continue hydration at home.  Limiting or discontinuing your use of amphetamines will help with this as well.  Avoid taking more than your prescribed doses of medications.  Return to the emergency department for any new or worsening symptoms of concern.

## 2022-07-06 NOTE — ED Notes (Signed)
Cleared by Poison control.

## 2022-07-07 ENCOUNTER — Encounter (HOSPITAL_COMMUNITY): Payer: Self-pay | Admitting: Emergency Medicine

## 2022-07-07 ENCOUNTER — Other Ambulatory Visit: Payer: Self-pay

## 2022-07-07 ENCOUNTER — Emergency Department (HOSPITAL_COMMUNITY)
Admission: EM | Admit: 2022-07-07 | Discharge: 2022-07-07 | Disposition: A | Discharge status: Home / Self Care | Payer: 59 | Attending: Emergency Medicine | Admitting: Emergency Medicine

## 2022-07-07 DIAGNOSIS — R69 Illness, unspecified: Secondary | ICD-10-CM | POA: Diagnosis not present

## 2022-07-07 DIAGNOSIS — A64 Unspecified sexually transmitted disease: Secondary | ICD-10-CM | POA: Insufficient documentation

## 2022-07-07 DIAGNOSIS — Z130 Encounter for screening for diseases of the blood and blood-forming organs and certain disorders involving the immune mechanism: Secondary | ICD-10-CM | POA: Insufficient documentation

## 2022-07-07 DIAGNOSIS — Z0000 Encounter for general adult medical examination without abnormal findings: Secondary | ICD-10-CM | POA: Diagnosis not present

## 2022-07-07 LAB — CBC WITH DIFFERENTIAL/PLATELET
Abs Immature Granulocytes: 0.02 10*3/uL (ref 0.00–0.07)
Basophils Absolute: 0.1 10*3/uL (ref 0.0–0.1)
Basophils Relative: 1 %
Eosinophils Absolute: 0.2 10*3/uL (ref 0.0–0.5)
Eosinophils Relative: 2 %
HCT: 35.5 % — ABNORMAL LOW (ref 39.0–52.0)
Hemoglobin: 11.6 g/dL — ABNORMAL LOW (ref 13.0–17.0)
Immature Granulocytes: 0 %
Lymphocytes Relative: 31 %
Lymphs Abs: 2.8 10*3/uL (ref 0.7–4.0)
MCH: 30.6 pg (ref 26.0–34.0)
MCHC: 32.7 g/dL (ref 30.0–36.0)
MCV: 93.7 fL (ref 80.0–100.0)
Monocytes Absolute: 0.8 10*3/uL (ref 0.1–1.0)
Monocytes Relative: 9 %
Neutro Abs: 5.1 10*3/uL (ref 1.7–7.7)
Neutrophils Relative %: 57 %
Platelets: 350 10*3/uL (ref 150–400)
RBC: 3.79 MIL/uL — ABNORMAL LOW (ref 4.22–5.81)
RDW: 13.2 % (ref 11.5–15.5)
WBC: 8.9 10*3/uL (ref 4.0–10.5)
nRBC: 0 % (ref 0.0–0.2)

## 2022-07-07 LAB — COMPREHENSIVE METABOLIC PANEL
ALT: 12 U/L (ref 0–44)
AST: 19 U/L (ref 15–41)
Albumin: 3.9 g/dL (ref 3.5–5.0)
Alkaline Phosphatase: 79 U/L (ref 38–126)
Anion gap: 10 (ref 5–15)
BUN: 12 mg/dL (ref 6–20)
CO2: 27 mmol/L (ref 22–32)
Calcium: 8.8 mg/dL — ABNORMAL LOW (ref 8.9–10.3)
Chloride: 99 mmol/L (ref 98–111)
Creatinine, Ser: 0.86 mg/dL (ref 0.61–1.24)
GFR, Estimated: >60 mL/min (ref >60)
Glucose, Bld: 100 mg/dL — ABNORMAL HIGH (ref 70–99)
Potassium: 3.9 mmol/L (ref 3.5–5.1)
Sodium: 136 mmol/L (ref 135–145)
Total Bilirubin: 0.3 mg/dL (ref 0.3–1.2)
Total Protein: 7.3 g/dL (ref 6.5–8.1)

## 2022-07-07 LAB — HIV ANTIBODY (ROUTINE TESTING W REFLEX): HIV Screen 4th Generation wRfx: NONREACTIVE

## 2022-07-07 LAB — URINALYSIS, ROUTINE W REFLEX MICROSCOPIC
Bilirubin Urine: NEGATIVE
Glucose, UA: NEGATIVE mg/dL
Hgb urine dipstick: NEGATIVE
Ketones, ur: NEGATIVE mg/dL
Leukocytes,Ua: NEGATIVE
Nitrite: NEGATIVE
Protein, ur: NEGATIVE mg/dL
Specific Gravity, Urine: 1.013 (ref 1.005–1.030)
pH: 7 (ref 5.0–8.0)

## 2022-07-07 LAB — HEPATITIS PANEL, ACUTE
HCV Ab: NONREACTIVE
Hep A IgM: NONREACTIVE
Hep B C IgM: NONREACTIVE
Hepatitis B Surface Ag: NONREACTIVE

## 2022-07-07 NOTE — ED Triage Notes (Signed)
Pt states he was denied to donate plasma d/t having and STD that he did not know he had. He does not know the name of the STD and would like to be checked.

## 2022-07-07 NOTE — ED Provider Notes (Signed)
[facility] Provider Note   CSN: [id number] Arrival date & time: 07/07/22  1227     History  Chief Complaint  Patient presents with   [place] [facility] TRANSMITTED DISEASE    Ronnie Wilson is a 47 y.o. male.  HPI     Ronnie Wilson is a 47 y.o. male with past medical history of psychosis and amphetamine use disorder who presents to the Emergency Department requesting evaluation for possible STD and blood-borne pathogen.  States that he attempted to donate plasma more than a week ago, blood was declined stating that he was told he had "something in my blood, but do not remember what he said."  States he received a letter from the facility that listed multiple things that may have shown up in his blood but he lost the paper and does not remember what was stated to him.  He denies any abdominal pain, vomiting, known history of HIV, hepatitis or syphilis.  Denies any burning with urination, genital lesions or penile discharge.  He is here requesting to be tested.    Home Medications Prior to Admission medications   Medication Sig Start Date End Date Taking? Authorizing Provider  albuterol (PROVENTIL HFA;VENTOLIN HFA) 108 (90 BASE) MCG/ACT inhaler Inhale 2 puffs into the lungs every 6 (six) hours as needed for wheezing or shortness of breath. Patient not taking: Reported on 04/04/2021    [provider]  Buprenorphine HCl-Naloxone HCl 8-2 MG FILM Place 0.5 Film under the tongue 4 (four) times daily. 06/29/22   [provider]  HYDROcodone-acetaminophen (NORCO) 5-325 MG tablet Take 1-2 tablets by mouth every 6 (six) hours as needed. Patient not taking: Reported on 04/04/2021 11/07/20   Veryl Speak, MD  methocarbamol (ROBAXIN) 500 MG tablet Take 1 tablet (500 mg total) by mouth 2 (two) times daily. Patient not taking: Reported on 04/04/2021 12/24/19   Recardo Evangelist, PA-C  nitrofurantoin, macrocrystal-monohydrate, (MACROBID) 100 MG  capsule Take 1 capsule (100 mg total) by mouth 2 (two) times daily. Patient not taking: Reported on 04/04/2021 12/21/19   Faustino Congress, NP  OLANZapine (ZYPREXA) 20 MG tablet Take 1 tablet (20 mg total) by mouth at bedtime. Patient not taking: Reported on 04/04/2021 06/19/19   Connye Burkitt, NP  phenazopyridine (PYRIDIUM) 200 MG tablet Take 1 tablet (200 mg total) by mouth 3 (three) times daily. Patient not taking: Reported on 04/04/2021 12/21/19   Faustino Congress, NP  predniSONE (DELTASONE) 10 MG tablet Take 2 tablets (20 mg total) by mouth 2 (two) times daily. Patient not taking: Reported on 04/04/2021 11/07/20   Veryl Speak, MD  QUEtiapine (SEROQUEL) 50 MG tablet Take 50 mg by mouth at bedtime. 06/22/22   [provider]      Allergies    Patient has no known allergies.    Review of Systems   Review of Systems  Constitutional:  Negative for appetite change and fever.  Respiratory:  Negative for shortness of breath.   Cardiovascular:  Negative for chest pain.  Gastrointestinal:  Negative for abdominal pain, nausea and vomiting.  Genitourinary:  Negative for difficulty urinating, dysuria, flank pain, genital sores, penile discharge, penile pain, penile swelling, scrotal swelling and testicular pain.  Musculoskeletal:  Negative for myalgias.  Skin:  Negative for rash.  Neurological:  Negative for weakness.    Physical Exam Updated Vital Signs BP (!) 159/106   Pulse 93   Temp 98.5 F (36.9 C) (Oral)   Ht 5'  5" (1.651 m)   Wt 64 kg   SpO2 97%   BMI 23.46 kg/m  Physical Exam Vitals and nursing note reviewed.  Constitutional:      General: He is not in acute distress.    Appearance: Normal appearance. He is not ill-appearing or toxic-appearing.  HENT:     Mouth/Throat:     Mouth: Mucous membranes are moist. No oral lesions.     Pharynx: Oropharynx is clear. No oropharyngeal exudate or posterior oropharyngeal erythema.  Cardiovascular:     Rate and Rhythm:  Normal rate and regular rhythm.     Pulses: Normal pulses.  Pulmonary:     Effort: Pulmonary effort is normal.  Abdominal:     General: There is no distension.     Palpations: Abdomen is soft. There is no hepatomegaly.     Tenderness: There is no abdominal tenderness.  Musculoskeletal:        General: Normal range of motion.  Skin:    General: Skin is warm.     Capillary Refill: Capillary refill takes less than 2 seconds.     Findings: No rash.  Neurological:     General: No focal deficit present.     Mental Status: He is alert.     Sensory: No sensory deficit.     Motor: No weakness.     ED Results / Procedures / Treatments   Labs (all labs ordered are listed, but only abnormal results are displayed) Labs Reviewed  CBC WITH DIFFERENTIAL/PLATELET - Abnormal; Notable for the following components:      Result Value   RBC 3.79 (*)    Hemoglobin 11.6 (*)    HCT 35.5 (*)    All other components within normal limits  COMPREHENSIVE METABOLIC PANEL - Abnormal; Notable for the following components:   Glucose, Bld 100 (*)    Calcium 8.8 (*)    All other components within normal limits  URINALYSIS, ROUTINE W REFLEX MICROSCOPIC  RPR  HEPATITIS PANEL, ACUTE  HIV ANTIBODY (ROUTINE TESTING W REFLEX)  GC/CHLAMYDIA PROBE AMP ([facility]) NOT AT [facility]    EKG None  Radiology   Procedures Procedures    Medications Ordered in ED Medications - No data to display  ED Course/ Medical Decision Making/ A&P                             Medical Decision Making Patient here requesting evaluation of possible STD or blood-borne illness.  States his blood was denied when he attempted to donate plasma.  He received letter from the facility stating possible causes but he lost the paperwork and does not remember what was stated to him.  He denies any symptoms at this time  Patient seen here treated for dehydration and medication overdose 2 days ago.  Differential would include but not  limited to possible blood-borne pathogen such as hepatitis, syphilis, HIV STI also considered  Amount and/or Complexity of Data Reviewed Labs: ordered.    Details: Labs interpreted by me, no evidence of leukocytosis, chemistries without derangement.  Urinalysis without evidence of infection.  Hepatitis panel, RPR and HIV all pending Discussion of management or test interpretation with external provider(s): Patient here requesting evaluation for blood-borne pathogen as he was informed of finding when he attempted to donate plasma he is unsure of what they found in his blood.  He is here for evaluation.  He denies symptoms at this time.  He understands  that remaining labs will be available for review on MyChart in 2 to 3 days and also that he will be notified of any positive results.  He appears appropriate for discharge home, all questions were answered.           Final Clinical Impression(s) / ED Diagnoses Final diagnoses:  Screening for blood disease    Rx / DC Orders ED Discharge Orders     None         Kem Parkinson, PA-C 07/09/22 1401    Davonna Belling, MD 07/09/22 [phone]

## 2022-07-07 NOTE — ED Notes (Signed)
Pt informed of need for urine sample. Urinal and collection cup given to pt.

## 2022-07-07 NOTE — Discharge Instructions (Addendum)
Some of your blood test today are still pending and may take 2 to 3 days to result.  You can review your results on the MyChart app.  You will be notified of any positive results as well.

## 2022-07-08 LAB — RPR: RPR Ser Ql: NONREACTIVE

## 2022-07-08 LAB — GC/CHLAMYDIA PROBE AMP (~~LOC~~) NOT AT ARMC
Chlamydia: NEGATIVE
Comment: NEGATIVE
Comment: NORMAL
Neisseria Gonorrhea: NEGATIVE

## 2022-07-09 DIAGNOSIS — F1120 Opioid dependence, uncomplicated: Secondary | ICD-10-CM | POA: Diagnosis not present

## 2022-07-12 DIAGNOSIS — F1120 Opioid dependence, uncomplicated: Secondary | ICD-10-CM | POA: Diagnosis not present

## 2022-07-13 DIAGNOSIS — M1990 Unspecified osteoarthritis, unspecified site: Secondary | ICD-10-CM | POA: Diagnosis not present

## 2022-07-13 DIAGNOSIS — F1120 Opioid dependence, uncomplicated: Secondary | ICD-10-CM | POA: Diagnosis not present

## 2022-07-13 DIAGNOSIS — F23 Brief psychotic disorder: Secondary | ICD-10-CM | POA: Diagnosis not present

## 2022-07-13 DIAGNOSIS — M5430 Sciatica, unspecified side: Secondary | ICD-10-CM | POA: Diagnosis not present

## 2022-07-13 DIAGNOSIS — M519 Unspecified thoracic, thoracolumbar and lumbosacral intervertebral disc disorder: Secondary | ICD-10-CM | POA: Diagnosis not present

## 2022-10-12 DIAGNOSIS — M5430 Sciatica, unspecified side: Secondary | ICD-10-CM | POA: Diagnosis not present

## 2022-10-12 DIAGNOSIS — F23 Brief psychotic disorder: Secondary | ICD-10-CM | POA: Diagnosis not present

## 2022-10-12 DIAGNOSIS — F1120 Opioid dependence, uncomplicated: Secondary | ICD-10-CM | POA: Diagnosis not present

## 2022-10-12 DIAGNOSIS — F1520 Other stimulant dependence, uncomplicated: Secondary | ICD-10-CM | POA: Diagnosis not present

## 2022-10-12 DIAGNOSIS — M519 Unspecified thoracic, thoracolumbar and lumbosacral intervertebral disc disorder: Secondary | ICD-10-CM | POA: Diagnosis not present

## 2022-10-15 DIAGNOSIS — Z825 Family history of asthma and other chronic lower respiratory diseases: Secondary | ICD-10-CM | POA: Diagnosis not present

## 2022-10-15 DIAGNOSIS — Z91199 Patient's noncompliance with other medical treatment and regimen due to unspecified reason: Secondary | ICD-10-CM | POA: Diagnosis not present

## 2022-10-15 DIAGNOSIS — M1990 Unspecified osteoarthritis, unspecified site: Secondary | ICD-10-CM | POA: Diagnosis not present

## 2022-10-15 DIAGNOSIS — F23 Brief psychotic disorder: Secondary | ICD-10-CM | POA: Diagnosis not present

## 2022-10-15 DIAGNOSIS — M5430 Sciatica, unspecified side: Secondary | ICD-10-CM | POA: Diagnosis not present

## 2022-10-15 DIAGNOSIS — Z5982 Transportation insecurity: Secondary | ICD-10-CM | POA: Diagnosis not present

## 2022-10-15 DIAGNOSIS — F1120 Opioid dependence, uncomplicated: Secondary | ICD-10-CM | POA: Diagnosis not present

## 2022-10-15 DIAGNOSIS — Z720 Tobacco use: Secondary | ICD-10-CM | POA: Diagnosis not present

## 2022-10-19 DIAGNOSIS — F1120 Opioid dependence, uncomplicated: Secondary | ICD-10-CM | POA: Diagnosis not present

## 2022-10-19 DIAGNOSIS — M519 Unspecified thoracic, thoracolumbar and lumbosacral intervertebral disc disorder: Secondary | ICD-10-CM | POA: Diagnosis not present

## 2022-10-19 DIAGNOSIS — F1520 Other stimulant dependence, uncomplicated: Secondary | ICD-10-CM | POA: Diagnosis not present

## 2022-10-19 DIAGNOSIS — F23 Brief psychotic disorder: Secondary | ICD-10-CM | POA: Diagnosis not present

## 2022-10-26 DIAGNOSIS — F1520 Other stimulant dependence, uncomplicated: Secondary | ICD-10-CM | POA: Diagnosis not present

## 2022-10-26 DIAGNOSIS — F1120 Opioid dependence, uncomplicated: Secondary | ICD-10-CM | POA: Diagnosis not present

## 2022-10-26 DIAGNOSIS — M519 Unspecified thoracic, thoracolumbar and lumbosacral intervertebral disc disorder: Secondary | ICD-10-CM | POA: Diagnosis not present

## 2022-10-26 DIAGNOSIS — F23 Brief psychotic disorder: Secondary | ICD-10-CM | POA: Diagnosis not present

## 2022-11-02 DIAGNOSIS — F23 Brief psychotic disorder: Secondary | ICD-10-CM | POA: Diagnosis not present

## 2022-11-02 DIAGNOSIS — M519 Unspecified thoracic, thoracolumbar and lumbosacral intervertebral disc disorder: Secondary | ICD-10-CM | POA: Diagnosis not present

## 2022-11-02 DIAGNOSIS — F1520 Other stimulant dependence, uncomplicated: Secondary | ICD-10-CM | POA: Diagnosis not present

## 2022-11-02 DIAGNOSIS — F1120 Opioid dependence, uncomplicated: Secondary | ICD-10-CM | POA: Diagnosis not present

## 2022-11-04 DIAGNOSIS — F1120 Opioid dependence, uncomplicated: Secondary | ICD-10-CM | POA: Diagnosis not present

## 2022-11-24 DIAGNOSIS — F1120 Opioid dependence, uncomplicated: Secondary | ICD-10-CM | POA: Diagnosis not present

## 2022-11-24 DIAGNOSIS — F1520 Other stimulant dependence, uncomplicated: Secondary | ICD-10-CM | POA: Diagnosis not present

## 2022-11-24 DIAGNOSIS — M519 Unspecified thoracic, thoracolumbar and lumbosacral intervertebral disc disorder: Secondary | ICD-10-CM | POA: Diagnosis not present

## 2022-11-24 DIAGNOSIS — F23 Brief psychotic disorder: Secondary | ICD-10-CM | POA: Diagnosis not present

## 2022-12-01 DIAGNOSIS — F23 Brief psychotic disorder: Secondary | ICD-10-CM | POA: Diagnosis not present

## 2022-12-01 DIAGNOSIS — F1120 Opioid dependence, uncomplicated: Secondary | ICD-10-CM | POA: Diagnosis not present

## 2022-12-01 DIAGNOSIS — M519 Unspecified thoracic, thoracolumbar and lumbosacral intervertebral disc disorder: Secondary | ICD-10-CM | POA: Diagnosis not present

## 2022-12-01 DIAGNOSIS — F1520 Other stimulant dependence, uncomplicated: Secondary | ICD-10-CM | POA: Diagnosis not present

## 2022-12-08 DIAGNOSIS — M1990 Unspecified osteoarthritis, unspecified site: Secondary | ICD-10-CM | POA: Diagnosis not present

## 2022-12-08 DIAGNOSIS — F23 Brief psychotic disorder: Secondary | ICD-10-CM | POA: Diagnosis not present

## 2022-12-08 DIAGNOSIS — M519 Unspecified thoracic, thoracolumbar and lumbosacral intervertebral disc disorder: Secondary | ICD-10-CM | POA: Diagnosis not present

## 2022-12-08 DIAGNOSIS — F1520 Other stimulant dependence, uncomplicated: Secondary | ICD-10-CM | POA: Diagnosis not present

## 2022-12-08 DIAGNOSIS — F1120 Opioid dependence, uncomplicated: Secondary | ICD-10-CM | POA: Diagnosis not present

## 2022-12-15 DIAGNOSIS — M519 Unspecified thoracic, thoracolumbar and lumbosacral intervertebral disc disorder: Secondary | ICD-10-CM | POA: Diagnosis not present

## 2022-12-15 DIAGNOSIS — F23 Brief psychotic disorder: Secondary | ICD-10-CM | POA: Diagnosis not present

## 2022-12-15 DIAGNOSIS — F1520 Other stimulant dependence, uncomplicated: Secondary | ICD-10-CM | POA: Diagnosis not present

## 2022-12-15 DIAGNOSIS — F1120 Opioid dependence, uncomplicated: Secondary | ICD-10-CM | POA: Diagnosis not present

## 2022-12-22 DIAGNOSIS — F1120 Opioid dependence, uncomplicated: Secondary | ICD-10-CM | POA: Diagnosis not present

## 2022-12-22 DIAGNOSIS — F23 Brief psychotic disorder: Secondary | ICD-10-CM | POA: Diagnosis not present

## 2022-12-22 DIAGNOSIS — F1520 Other stimulant dependence, uncomplicated: Secondary | ICD-10-CM | POA: Diagnosis not present

## 2022-12-22 DIAGNOSIS — M519 Unspecified thoracic, thoracolumbar and lumbosacral intervertebral disc disorder: Secondary | ICD-10-CM | POA: Diagnosis not present

## 2022-12-29 DIAGNOSIS — M1990 Unspecified osteoarthritis, unspecified site: Secondary | ICD-10-CM | POA: Diagnosis not present

## 2022-12-29 DIAGNOSIS — M5430 Sciatica, unspecified side: Secondary | ICD-10-CM | POA: Diagnosis not present

## 2022-12-29 DIAGNOSIS — F1520 Other stimulant dependence, uncomplicated: Secondary | ICD-10-CM | POA: Diagnosis not present

## 2022-12-29 DIAGNOSIS — F1120 Opioid dependence, uncomplicated: Secondary | ICD-10-CM | POA: Diagnosis not present

## 2022-12-29 DIAGNOSIS — M519 Unspecified thoracic, thoracolumbar and lumbosacral intervertebral disc disorder: Secondary | ICD-10-CM | POA: Diagnosis not present

## 2023-01-05 DIAGNOSIS — M1990 Unspecified osteoarthritis, unspecified site: Secondary | ICD-10-CM | POA: Diagnosis not present

## 2023-01-05 DIAGNOSIS — F1120 Opioid dependence, uncomplicated: Secondary | ICD-10-CM | POA: Diagnosis not present

## 2023-01-05 DIAGNOSIS — F1520 Other stimulant dependence, uncomplicated: Secondary | ICD-10-CM | POA: Diagnosis not present

## 2023-01-05 DIAGNOSIS — M5430 Sciatica, unspecified side: Secondary | ICD-10-CM | POA: Diagnosis not present

## 2023-01-12 DIAGNOSIS — F1120 Opioid dependence, uncomplicated: Secondary | ICD-10-CM | POA: Diagnosis not present

## 2023-01-12 DIAGNOSIS — M519 Unspecified thoracic, thoracolumbar and lumbosacral intervertebral disc disorder: Secondary | ICD-10-CM | POA: Diagnosis not present

## 2023-01-12 DIAGNOSIS — F23 Brief psychotic disorder: Secondary | ICD-10-CM | POA: Diagnosis not present

## 2023-01-12 DIAGNOSIS — F1520 Other stimulant dependence, uncomplicated: Secondary | ICD-10-CM | POA: Diagnosis not present

## 2023-01-13 DIAGNOSIS — F1520 Other stimulant dependence, uncomplicated: Secondary | ICD-10-CM | POA: Diagnosis not present

## 2023-01-13 DIAGNOSIS — F1120 Opioid dependence, uncomplicated: Secondary | ICD-10-CM | POA: Diagnosis not present

## 2023-01-20 DIAGNOSIS — F23 Brief psychotic disorder: Secondary | ICD-10-CM | POA: Diagnosis not present

## 2023-01-20 DIAGNOSIS — F1120 Opioid dependence, uncomplicated: Secondary | ICD-10-CM | POA: Diagnosis not present

## 2023-01-20 DIAGNOSIS — F1520 Other stimulant dependence, uncomplicated: Secondary | ICD-10-CM | POA: Diagnosis not present

## 2023-01-20 DIAGNOSIS — M519 Unspecified thoracic, thoracolumbar and lumbosacral intervertebral disc disorder: Secondary | ICD-10-CM | POA: Diagnosis not present

## 2023-03-14 ENCOUNTER — Telehealth: Payer: Self-pay | Admitting: Internal Medicine

## 2023-03-14 NOTE — Telephone Encounter (Signed)
Needs new patient approval

## 2023-03-15 NOTE — Telephone Encounter (Signed)
Called patient no answer.

## 2024-06-13 ENCOUNTER — Ambulatory Visit: Payer: MEDICAID

## 2024-06-13 VITALS — BP 136/90 | HR 89 | Ht 65.0 in | Wt 132.2 lb

## 2024-06-13 DIAGNOSIS — R052 Subacute cough: Secondary | ICD-10-CM

## 2024-06-13 DIAGNOSIS — F419 Anxiety disorder, unspecified: Secondary | ICD-10-CM | POA: Diagnosis not present

## 2024-06-13 DIAGNOSIS — M545 Low back pain, unspecified: Secondary | ICD-10-CM | POA: Diagnosis not present

## 2024-06-13 DIAGNOSIS — G8929 Other chronic pain: Secondary | ICD-10-CM | POA: Diagnosis not present

## 2024-06-13 DIAGNOSIS — D649 Anemia, unspecified: Secondary | ICD-10-CM | POA: Diagnosis not present

## 2024-06-13 DIAGNOSIS — M25512 Pain in left shoulder: Secondary | ICD-10-CM | POA: Diagnosis not present

## 2024-06-13 MED ORDER — HYDROXYZINE PAMOATE 25 MG PO CAPS: 25.0000 mg | ORAL_CAPSULE | Freq: Three times a day (TID) | ORAL | 5 refills | Status: AC | PRN

## 2024-06-13 MED ORDER — GABAPENTIN 300 MG PO CAPS: 300.0000 mg | ORAL_CAPSULE | Freq: Two times a day (BID) | ORAL | 3 refills | Status: AC
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
## 2024-06-14 LAB — CMP14+EGFR
ALT: 9 IU/L (ref 0–44)
AST: 14 IU/L (ref 0–40)
Albumin: 4.2 g/dL (ref 4.1–5.1)
Alkaline Phosphatase: 97 IU/L (ref 47–123)
BUN/Creatinine Ratio: 7 — ABNORMAL LOW (ref 9–20)
BUN: 6 mg/dL (ref 6–24)
Bilirubin Total: <0.2 mg/dL (ref 0.0–1.2)
CO2: 22 mmol/L (ref 20–29)
Calcium: 9.5 mg/dL (ref 8.7–10.2)
Chloride: 104 mmol/L (ref 96–106)
Creatinine, Ser: 0.88 mg/dL (ref 0.76–1.27)
Globulin, Total: 2.6 g/dL (ref 1.5–4.5)
Glucose: 94 mg/dL (ref 70–99)
Potassium: 4 mmol/L (ref 3.5–5.2)
Sodium: 139 mmol/L (ref 134–144)
Total Protein: 6.8 g/dL (ref 6.0–8.5)
eGFR: 105 mL/min/1.73

## 2024-06-14 LAB — CBC WITH DIFFERENTIAL/PLATELET
Basophils Absolute: 0.1 x10E3/uL (ref 0.0–0.2)
Basos: 1 %
EOS (ABSOLUTE): 0.1 x10E3/uL (ref 0.0–0.4)
Eos: 1 %
Hematocrit: 36.8 % — ABNORMAL LOW (ref 37.5–51.0)
Hemoglobin: 12.7 g/dL — ABNORMAL LOW (ref 13.0–17.7)
Immature Grans (Abs): 0 x10E3/uL (ref 0.0–0.1)
Immature Granulocytes: 0 %
Lymphocytes Absolute: 2.6 x10E3/uL (ref 0.7–3.1)
Lymphs: 28 %
MCH: 30.8 pg (ref 26.6–33.0)
MCHC: 34.5 g/dL (ref 31.5–35.7)
MCV: 89 fL (ref 79–97)
Monocytes Absolute: 0.8 x10E3/uL (ref 0.1–0.9)
Monocytes: 9 %
Neutrophils Absolute: 5.5 x10E3/uL (ref 1.4–7.0)
Neutrophils: 61 %
Platelets: 408 x10E3/uL (ref 150–450)
RBC: 4.12 x10E6/uL — ABNORMAL LOW (ref 4.14–5.80)
RDW: 12.9 % (ref 11.6–15.4)
WBC: 9.2 x10E3/uL (ref 3.4–10.8)

## 2024-06-14 LAB — IRON,TIBC AND FERRITIN PANEL
Ferritin: 123 ng/mL (ref 30–400)
Iron Saturation: 25 % (ref 15–55)
Iron: 59 ug/dL (ref 38–169)
Total Iron Binding Capacity: 236 ug/dL — ABNORMAL LOW (ref 250–450)
UIBC: 177 ug/dL (ref 111–343)

## 2024-06-20 ENCOUNTER — Ambulatory Visit: Payer: Self-pay

## 2024-06-20 DIAGNOSIS — R052 Subacute cough: Secondary | ICD-10-CM | POA: Insufficient documentation

## 2024-06-20 DIAGNOSIS — D649 Anemia, unspecified: Secondary | ICD-10-CM | POA: Insufficient documentation

## 2024-06-20 DIAGNOSIS — G8929 Other chronic pain: Secondary | ICD-10-CM | POA: Insufficient documentation

## 2024-06-20 DIAGNOSIS — F419 Anxiety disorder, unspecified: Secondary | ICD-10-CM | POA: Insufficient documentation

## 2024-06-20 DIAGNOSIS — M545 Low back pain, unspecified: Secondary | ICD-10-CM | POA: Insufficient documentation

## 2024-06-20 NOTE — Assessment & Plan Note (Signed)
 Panic attacks and insomnia. Hydroxyzine  discussed for management. - Prescribed hydroxyzine  as needed.

## 2024-06-20 NOTE — Assessment & Plan Note (Signed)
 Slight anemia with fatigue and cold intolerance. No signs of bleeding disorder. - Ordered blood work to recheck anemia.

## 2024-06-20 NOTE — Assessment & Plan Note (Signed)
 Chronic low back pain with muscle spasms Chronic pain likely from cumulative trauma. - Prescribed gabapentin .

## 2024-06-20 NOTE — Assessment & Plan Note (Signed)
 Chronic pain with locking and tingling, possible prosthesis loosening or wear. - Referred to orthopedic specialist in [facility] for evaluation.

## 2024-06-20 NOTE — Assessment & Plan Note (Signed)
 Intermittent cough, possibly post-pneumonia. No active pneumonia signs. - No chest x-ray unless pneumonia symptoms develop.

## 2024-12-11 ENCOUNTER — Ambulatory Visit: Payer: Self-pay
